# Patient Record
Sex: Female | Born: 1955 | Race: White | Hispanic: No | Marital: Married | Smoking: Never smoker
Health system: Southern US, Community
[De-identification: ages and names within clinical notes are randomized; demographics above are authoritative.]

## PROBLEM LIST (undated history)

## (undated) DIAGNOSIS — K760 Fatty (change of) liver, not elsewhere classified: Secondary | ICD-10-CM

## (undated) DIAGNOSIS — E7800 Pure hypercholesterolemia, unspecified: Secondary | ICD-10-CM

## (undated) DIAGNOSIS — F419 Anxiety disorder, unspecified: Secondary | ICD-10-CM

## (undated) DIAGNOSIS — IMO0002 Reserved for concepts with insufficient information to code with codable children: Secondary | ICD-10-CM

## (undated) DIAGNOSIS — R0602 Shortness of breath: Secondary | ICD-10-CM

## (undated) DIAGNOSIS — M65311 Trigger thumb, right thumb: Secondary | ICD-10-CM

## (undated) DIAGNOSIS — E785 Hyperlipidemia, unspecified: Secondary | ICD-10-CM

## (undated) DIAGNOSIS — F329 Major depressive disorder, single episode, unspecified: Secondary | ICD-10-CM

## (undated) DIAGNOSIS — I10 Essential (primary) hypertension: Secondary | ICD-10-CM

## (undated) DIAGNOSIS — M5020 Other cervical disc displacement, unspecified cervical region: Secondary | ICD-10-CM

## (undated) DIAGNOSIS — F32A Depression, unspecified: Secondary | ICD-10-CM

## (undated) HISTORY — PX: TUBAL LIGATION: SHX77

## (undated) HISTORY — DX: Fatty (change of) liver, not elsewhere classified: K76.0

## (undated) HISTORY — PX: CARPAL TUNNEL RELEASE: SHX101

## (undated) HISTORY — DX: Hyperlipidemia, unspecified: E78.5

## (undated) HISTORY — DX: Other cervical disc displacement, unspecified cervical region: M50.20

## (undated) HISTORY — DX: Shortness of breath: R06.02

---

## 1984-07-05 HISTORY — PX: TUBAL LIGATION: SHX77

## 1998-09-24 ENCOUNTER — Encounter: Payer: Self-pay | Admitting: Obstetrics and Gynecology

## 1998-09-24 ENCOUNTER — Ambulatory Visit (HOSPITAL_COMMUNITY): Admission: RE | Admit: 1998-09-24 | Discharge: 1998-09-24 | Payer: Self-pay | Admitting: Obstetrics and Gynecology

## 1999-09-29 ENCOUNTER — Ambulatory Visit (HOSPITAL_COMMUNITY): Admission: RE | Admit: 1999-09-29 | Discharge: 1999-09-29 | Payer: Self-pay | Admitting: Obstetrics and Gynecology

## 1999-09-29 ENCOUNTER — Encounter: Payer: Self-pay | Admitting: Obstetrics and Gynecology

## 1999-10-23 ENCOUNTER — Other Ambulatory Visit: Admission: RE | Admit: 1999-10-23 | Discharge: 1999-10-23 | Payer: Self-pay | Admitting: Obstetrics and Gynecology

## 2000-09-29 ENCOUNTER — Ambulatory Visit (HOSPITAL_COMMUNITY): Admission: RE | Admit: 2000-09-29 | Discharge: 2000-09-29 | Payer: Self-pay | Admitting: Obstetrics and Gynecology

## 2000-09-29 ENCOUNTER — Encounter: Payer: Self-pay | Admitting: Obstetrics and Gynecology

## 2000-12-13 ENCOUNTER — Other Ambulatory Visit: Admission: RE | Admit: 2000-12-13 | Discharge: 2000-12-13 | Payer: Self-pay | Admitting: Obstetrics and Gynecology

## 2001-11-10 ENCOUNTER — Encounter: Payer: Self-pay | Admitting: Obstetrics and Gynecology

## 2001-11-10 ENCOUNTER — Ambulatory Visit (HOSPITAL_COMMUNITY): Admission: RE | Admit: 2001-11-10 | Discharge: 2001-11-10 | Payer: Self-pay | Admitting: Obstetrics and Gynecology

## 2002-11-14 ENCOUNTER — Ambulatory Visit (HOSPITAL_COMMUNITY): Admission: RE | Admit: 2002-11-14 | Discharge: 2002-11-14 | Payer: Self-pay | Admitting: Obstetrics and Gynecology

## 2002-11-14 ENCOUNTER — Encounter: Payer: Self-pay | Admitting: Obstetrics and Gynecology

## 2003-11-19 ENCOUNTER — Ambulatory Visit (HOSPITAL_COMMUNITY): Admission: RE | Admit: 2003-11-19 | Discharge: 2003-11-19 | Payer: Self-pay | Admitting: Obstetrics and Gynecology

## 2003-12-26 ENCOUNTER — Other Ambulatory Visit: Admission: RE | Admit: 2003-12-26 | Discharge: 2003-12-26 | Payer: Self-pay | Admitting: Obstetrics and Gynecology

## 2004-12-07 ENCOUNTER — Ambulatory Visit (HOSPITAL_COMMUNITY): Admission: RE | Admit: 2004-12-07 | Discharge: 2004-12-07 | Payer: Self-pay | Admitting: Obstetrics and Gynecology

## 2005-01-27 ENCOUNTER — Other Ambulatory Visit: Admission: RE | Admit: 2005-01-27 | Discharge: 2005-01-27 | Payer: Self-pay | Admitting: Obstetrics and Gynecology

## 2005-03-15 ENCOUNTER — Encounter: Admission: RE | Admit: 2005-03-15 | Discharge: 2005-03-15 | Payer: Self-pay | Admitting: Family Medicine

## 2005-12-08 ENCOUNTER — Ambulatory Visit (HOSPITAL_COMMUNITY): Admission: RE | Admit: 2005-12-08 | Discharge: 2005-12-08 | Payer: Self-pay | Admitting: Obstetrics and Gynecology

## 2006-12-13 ENCOUNTER — Ambulatory Visit (HOSPITAL_COMMUNITY): Admission: RE | Admit: 2006-12-13 | Discharge: 2006-12-13 | Payer: Self-pay | Admitting: Obstetrics and Gynecology

## 2007-04-28 ENCOUNTER — Encounter: Admission: RE | Admit: 2007-04-28 | Discharge: 2007-04-28 | Payer: Self-pay | Admitting: Gastroenterology

## 2007-12-14 ENCOUNTER — Ambulatory Visit (HOSPITAL_COMMUNITY): Admission: RE | Admit: 2007-12-14 | Discharge: 2007-12-14 | Payer: Self-pay | Admitting: Obstetrics and Gynecology

## 2008-06-21 ENCOUNTER — Encounter: Admission: RE | Admit: 2008-06-21 | Discharge: 2008-06-21 | Payer: Self-pay | Admitting: Family Medicine

## 2008-12-18 ENCOUNTER — Ambulatory Visit (HOSPITAL_COMMUNITY): Admission: RE | Admit: 2008-12-18 | Discharge: 2008-12-18 | Payer: Self-pay | Admitting: Family Medicine

## 2009-01-24 ENCOUNTER — Ambulatory Visit (HOSPITAL_BASED_OUTPATIENT_CLINIC_OR_DEPARTMENT_OTHER): Admission: RE | Admit: 2009-01-24 | Discharge: 2009-01-24 | Payer: Self-pay | Admitting: Orthopedic Surgery

## 2009-01-24 HISTORY — PX: TRIGGER FINGER RELEASE: SHX641

## 2009-07-03 ENCOUNTER — Ambulatory Visit (HOSPITAL_BASED_OUTPATIENT_CLINIC_OR_DEPARTMENT_OTHER): Admission: RE | Admit: 2009-07-03 | Discharge: 2009-07-03 | Payer: Self-pay | Admitting: Orthopedic Surgery

## 2009-07-03 HISTORY — PX: TRIGGER FINGER RELEASE: SHX641

## 2009-11-23 ENCOUNTER — Ambulatory Visit: Payer: Self-pay | Admitting: Cardiology

## 2009-11-23 ENCOUNTER — Encounter (INDEPENDENT_AMBULATORY_CARE_PROVIDER_SITE_OTHER): Payer: Self-pay | Admitting: Internal Medicine

## 2009-11-24 ENCOUNTER — Ambulatory Visit: Payer: Self-pay | Admitting: Vascular Surgery

## 2009-11-24 ENCOUNTER — Encounter (INDEPENDENT_AMBULATORY_CARE_PROVIDER_SITE_OTHER): Payer: Self-pay | Admitting: Internal Medicine

## 2009-12-24 ENCOUNTER — Ambulatory Visit (HOSPITAL_COMMUNITY): Admission: RE | Admit: 2009-12-24 | Discharge: 2009-12-24 | Payer: Self-pay | Admitting: Obstetrics and Gynecology

## 2010-06-11 ENCOUNTER — Observation Stay (HOSPITAL_COMMUNITY): Admission: EM | Admit: 2010-06-11 | Discharge: 2009-11-24 | Payer: Self-pay | Admitting: Emergency Medicine

## 2010-09-21 LAB — URINE MICROSCOPIC-ADD ON

## 2010-09-21 LAB — COMPREHENSIVE METABOLIC PANEL
ALT: 34 U/L (ref 0–35)
AST: 37 U/L (ref 0–37)
Albumin: 4.3 g/dL (ref 3.5–5.2)
Alkaline Phosphatase: 80 U/L (ref 39–117)
BUN: 25 mg/dL — ABNORMAL HIGH (ref 6–23)
CO2: 20 mEq/L (ref 19–32)
Calcium: 9.8 mg/dL (ref 8.4–10.5)
Chloride: 104 mEq/L (ref 96–112)
Creatinine, Ser: 1.1 mg/dL (ref 0.4–1.2)
GFR calc Af Amer: >60 mL/min (ref >60)
GFR calc non Af Amer: 52 mL/min — ABNORMAL LOW (ref >60)
Glucose, Bld: 173 mg/dL — ABNORMAL HIGH (ref 70–99)
Potassium: 4.1 mEq/L (ref 3.5–5.1)
Sodium: 137 mEq/L (ref 135–145)
Total Bilirubin: 0.7 mg/dL (ref 0.3–1.2)
Total Protein: 7 g/dL (ref 6.0–8.3)

## 2010-09-21 LAB — DIFFERENTIAL
Basophils Absolute: 0.1 10*3/uL (ref 0.0–0.1)
Basophils Relative: 1 % (ref 0–1)
Eosinophils Absolute: 0 10*3/uL (ref 0.0–0.7)
Eosinophils Relative: 0 % (ref 0–5)
Lymphocytes Relative: 19 % (ref 12–46)
Lymphs Abs: 2 10*3/uL (ref 0.7–4.0)
Monocytes Absolute: 0.5 10*3/uL (ref 0.1–1.0)
Monocytes Relative: 5 % (ref 3–12)
Neutro Abs: 8.3 10*3/uL — ABNORMAL HIGH (ref 1.7–7.7)
Neutrophils Relative %: 76 % (ref 43–77)

## 2010-09-21 LAB — BASIC METABOLIC PANEL
BUN: 18 mg/dL (ref 6–23)
CO2: 24 mEq/L (ref 19–32)
Calcium: 8.5 mg/dL (ref 8.4–10.5)
Chloride: 111 mEq/L (ref 96–112)
Creatinine, Ser: 0.98 mg/dL (ref 0.4–1.2)
GFR calc Af Amer: >60 mL/min (ref >60)
GFR calc non Af Amer: 59 mL/min — ABNORMAL LOW (ref >60)
Glucose, Bld: 160 mg/dL — ABNORMAL HIGH (ref 70–99)
Potassium: 4.1 mEq/L (ref 3.5–5.1)
Sodium: 140 mEq/L (ref 135–145)

## 2010-09-21 LAB — TSH: TSH: 1.853 u[IU]/mL (ref 0.350–4.500)

## 2010-09-21 LAB — CBC
HCT: 29.8 % — ABNORMAL LOW (ref 36.0–46.0)
HCT: 34.5 % — ABNORMAL LOW (ref 36.0–46.0)
Hemoglobin: 10.2 g/dL — ABNORMAL LOW (ref 12.0–15.0)
Hemoglobin: 11.8 g/dL — ABNORMAL LOW (ref 12.0–15.0)
MCHC: 34 g/dL (ref 30.0–36.0)
MCHC: 34.2 g/dL (ref 30.0–36.0)
MCV: 92.4 fL (ref 78.0–100.0)
MCV: 92.6 fL (ref 78.0–100.0)
Platelets: 159 10*3/uL (ref 150–400)
Platelets: 245 10*3/uL (ref 150–400)
RBC: 3.22 MIL/uL — ABNORMAL LOW (ref 3.87–5.11)
RBC: 3.74 MIL/uL — ABNORMAL LOW (ref 3.87–5.11)
RDW: 13 % (ref 11.5–15.5)
RDW: 13.2 % (ref 11.5–15.5)
WBC: 11 10*3/uL — ABNORMAL HIGH (ref 4.0–10.5)
WBC: 6.9 10*3/uL (ref 4.0–10.5)

## 2010-09-21 LAB — LIPID PANEL
Cholesterol: 101 mg/dL (ref 0–200)
HDL: 34 mg/dL — ABNORMAL LOW (ref >39)
LDL Cholesterol: 39 mg/dL (ref 0–99)
Total CHOL/HDL Ratio: 3 RATIO
Triglycerides: 138 mg/dL (ref <150)
VLDL: 28 mg/dL (ref 0–40)

## 2010-09-21 LAB — URINALYSIS, ROUTINE W REFLEX MICROSCOPIC
Bilirubin Urine: NEGATIVE
Glucose, UA: NEGATIVE mg/dL
Hgb urine dipstick: NEGATIVE
Ketones, ur: NEGATIVE mg/dL
Nitrite: NEGATIVE
Protein, ur: NEGATIVE mg/dL
Specific Gravity, Urine: 1.029 (ref 1.005–1.030)
Urobilinogen, UA: 0.2 mg/dL (ref 0.0–1.0)
pH: 5.5 (ref 5.0–8.0)

## 2010-09-21 LAB — GLUCOSE, CAPILLARY
Glucose-Capillary: 123 mg/dL — ABNORMAL HIGH (ref 70–99)
Glucose-Capillary: 138 mg/dL — ABNORMAL HIGH (ref 70–99)
Glucose-Capillary: 176 mg/dL — ABNORMAL HIGH (ref 70–99)
Glucose-Capillary: 185 mg/dL — ABNORMAL HIGH (ref 70–99)
Glucose-Capillary: 185 mg/dL — ABNORMAL HIGH (ref 70–99)
Glucose-Capillary: 196 mg/dL — ABNORMAL HIGH (ref 70–99)

## 2010-09-21 LAB — CARDIAC PANEL(CRET KIN+CKTOT+MB+TROPI)
CK, MB: 2 ng/mL (ref 0.3–4.0)
CK, MB: 2.1 ng/mL (ref 0.3–4.0)
Relative Index: 1.4 (ref 0.0–2.5)
Relative Index: 1.4 (ref 0.0–2.5)
Total CK: 144 U/L (ref 7–177)
Total CK: 148 U/L (ref 7–177)
Troponin I: 0.01 ng/mL (ref 0.00–0.06)
Troponin I: <0.01 ng/mL (ref 0.00–0.06)

## 2010-09-21 LAB — CK TOTAL AND CKMB (NOT AT ARMC)
CK, MB: 2.2 ng/mL (ref 0.3–4.0)
Relative Index: 1.6 (ref 0.0–2.5)
Total CK: 140 U/L (ref 7–177)

## 2010-09-21 LAB — POCT CARDIAC MARKERS
CKMB, poc: <1 ng/mL — ABNORMAL LOW (ref 1.0–8.0)
Myoglobin, poc: 128 ng/mL (ref 12–200)
Troponin i, poc: <0.05 ng/mL (ref 0.00–0.09)

## 2010-09-21 LAB — HEMOGLOBIN A1C
Hgb A1c MFr Bld: 7.4 % — ABNORMAL HIGH (ref <5.7)
Mean Plasma Glucose: 166 mg/dL — ABNORMAL HIGH (ref <117)

## 2010-09-21 LAB — TROPONIN I: Troponin I: 0.01 ng/mL (ref 0.00–0.06)

## 2010-10-05 LAB — POCT I-STAT, CHEM 8
BUN: 15 mg/dL (ref 6–23)
Calcium, Ion: 1.22 mmol/L (ref 1.12–1.32)
Chloride: 104 mEq/L (ref 96–112)
Creatinine, Ser: 0.7 mg/dL (ref 0.4–1.2)
Glucose, Bld: 202 mg/dL — ABNORMAL HIGH (ref 70–99)
HCT: 38 % (ref 36.0–46.0)
Hemoglobin: 12.9 g/dL (ref 12.0–15.0)
Potassium: 4.4 mEq/L (ref 3.5–5.1)
Sodium: 136 mEq/L (ref 135–145)
TCO2: 23 mmol/L (ref 0–100)

## 2010-10-05 LAB — GLUCOSE, CAPILLARY: Glucose-Capillary: 155 mg/dL — ABNORMAL HIGH (ref 70–99)

## 2010-10-11 LAB — POCT HEMOGLOBIN-HEMACUE: Hemoglobin: 14.8 g/dL (ref 12.0–15.0)

## 2010-10-11 LAB — BASIC METABOLIC PANEL
BUN: 22 mg/dL (ref 6–23)
CO2: 27 mEq/L (ref 19–32)
Calcium: 9.7 mg/dL (ref 8.4–10.5)
Chloride: 102 mEq/L (ref 96–112)
Creatinine, Ser: 1.01 mg/dL (ref 0.4–1.2)
GFR calc Af Amer: >60 mL/min (ref >60)
GFR calc non Af Amer: 57 mL/min — ABNORMAL LOW (ref >60)
Glucose, Bld: 215 mg/dL — ABNORMAL HIGH (ref 70–99)
Potassium: 4.7 mEq/L (ref 3.5–5.1)
Sodium: 137 mEq/L (ref 135–145)

## 2010-10-11 LAB — GLUCOSE, CAPILLARY: Glucose-Capillary: 113 mg/dL — ABNORMAL HIGH (ref 70–99)

## 2010-11-17 NOTE — Op Note (Signed)
**Note Morgan Guerra** NAMEDEVRA, STARE             ACCOUNT NO.:  [id number]   MEDICAL RECORD NO.:  [id number]          PATIENT TYPE:  AMB   LOCATION:  DSC                          FACILITY:  [facility]   PHYSICIAN:  Cindee Salt, M.D.       DATE OF BIRTH:  October 29, 1955   DATE OF PROCEDURE:  01/24/2009  DATE OF DISCHARGE:                               OPERATIVE REPORT   PREOPERATIVE DIAGNOSIS:  Stenosing tenosynovitis, left ring finger.   POSTOPERATIVE DIAGNOSIS:  Stenosing tenosynovitis, left ring finger.   OPERATION:  Release of A1 pulley, left ring finger.   SURGEON:  Cindee Salt, MD   ASSISTANT:  Carolyne Fiscal, RN   ANESTHESIA:  Forearm based IV regional with local infiltration.   ANESTHESIOLOGIST:  Quita Skye. Krista Blue, MD   HISTORY:  The patient is a 55 year old female with a history of  triggering of her left ring finger.  This has not responded to  conservative treatment.  She has elected to undergo surgical release.  Pre, peri, and postoperative course has been discussed along with risks  and complications.  She is aware that there is no guarantee with the  surgery, possibility of infection, recurrence, injury to arteries,  nerves infection, possibility of complex regional pain, and stiffness.  She has elected to proceed to have this done.  In preoperative area, the  patient is seen, the extremity marked by both the patient and surgeon,  and antibiotic given.   PROCEDURE IN DETAIL:  The patient was brought to the operating room  where a forearm based IV regional anesthetic was carried out without  difficulty.  She was prepped using ChloraPrep, supine position with her  left arm free.  A 3-minute dry time was allowed.  Time-out was taken  confirming the patient and the procedure.  The patient was then draped.  An oblique incision was made over the A1 pulley of the left ring finger  and carried down through subcutaneous tissue.  Bleeders were  electrocauterized.  All neurovascular structures identified  and  protected.  The A1 pulley was identified.  This was found to be  thickened with a moderate tenosynovitis present proximally.  An incision  was then made on the radial aspect releasing the entire A1 pulley.  A  small incision was made centrally in the A2 pulley.  Flexion/extension  revealed no further triggering.  The wound was irrigated and closed with  interrupted 5-0 Vicryl Rapide sutures.  A local infiltration was given  with 0.25% Marcaine without epinephrine.  A  sterile compressive dressing with the fingers free was applied.  On  deflation of the tourniquet, all fingers immediately pinked.  She was  taken to the recovery room for observation in satisfactory condition.  She will be discharged home to return to the [facility]  in 1 week on Vicodin.     ______________________________  Cindee Salt, M.D.    ______________________________  Cindee Salt, M.D.    GK/MEDQ  D:  01/24/2009  T:  01/25/2009  Job:  [id number]   cc:   Donia Guiles, M.D.

## 2010-11-26 ENCOUNTER — Other Ambulatory Visit (HOSPITAL_COMMUNITY): Payer: Self-pay | Admitting: Obstetrics and Gynecology

## 2010-11-26 DIAGNOSIS — Z1231 Encounter for screening mammogram for malignant neoplasm of breast: Secondary | ICD-10-CM

## 2011-01-07 ENCOUNTER — Ambulatory Visit (HOSPITAL_COMMUNITY)
Admission: RE | Admit: 2011-01-07 | Discharge: 2011-01-07 | Disposition: A | Discharge status: Home / Self Care | Payer: BC Managed Care – PPO | Source: Ambulatory Visit | Attending: Obstetrics and Gynecology | Admitting: Obstetrics and Gynecology

## 2011-01-07 DIAGNOSIS — Z1231 Encounter for screening mammogram for malignant neoplasm of breast: Secondary | ICD-10-CM | POA: Insufficient documentation

## 2011-04-20 ENCOUNTER — Ambulatory Visit (HOSPITAL_BASED_OUTPATIENT_CLINIC_OR_DEPARTMENT_OTHER)
Admission: RE | Admit: 2011-04-20 | Payer: BC Managed Care – PPO | Source: Ambulatory Visit | Admitting: Orthopedic Surgery

## 2011-05-12 ENCOUNTER — Other Ambulatory Visit: Payer: Self-pay | Admitting: Orthopedic Surgery

## 2011-05-17 ENCOUNTER — Encounter (HOSPITAL_BASED_OUTPATIENT_CLINIC_OR_DEPARTMENT_OTHER): Payer: Self-pay | Admitting: *Deleted

## 2011-05-18 ENCOUNTER — Encounter (HOSPITAL_BASED_OUTPATIENT_CLINIC_OR_DEPARTMENT_OTHER)
Admission: RE | Disposition: A | Discharge status: Home / Self Care | Payer: Self-pay | Source: Ambulatory Visit | Attending: Orthopedic Surgery

## 2011-05-18 ENCOUNTER — Ambulatory Visit (HOSPITAL_BASED_OUTPATIENT_CLINIC_OR_DEPARTMENT_OTHER): Payer: BC Managed Care – PPO | Admitting: Anesthesiology

## 2011-05-18 ENCOUNTER — Encounter (HOSPITAL_BASED_OUTPATIENT_CLINIC_OR_DEPARTMENT_OTHER): Payer: Self-pay | Admitting: Anesthesiology

## 2011-05-18 ENCOUNTER — Encounter (HOSPITAL_BASED_OUTPATIENT_CLINIC_OR_DEPARTMENT_OTHER): Payer: Self-pay | Admitting: *Deleted

## 2011-05-18 ENCOUNTER — Encounter (HOSPITAL_BASED_OUTPATIENT_CLINIC_OR_DEPARTMENT_OTHER): Payer: Self-pay | Admitting: Orthopedic Surgery

## 2011-05-18 ENCOUNTER — Ambulatory Visit (HOSPITAL_BASED_OUTPATIENT_CLINIC_OR_DEPARTMENT_OTHER)
Admission: RE | Admit: 2011-05-18 | Discharge: 2011-05-18 | Disposition: A | Discharge status: Home / Self Care | Payer: BC Managed Care – PPO | Source: Ambulatory Visit | Attending: Orthopedic Surgery | Admitting: Orthopedic Surgery

## 2011-05-18 DIAGNOSIS — E669 Obesity, unspecified: Secondary | ICD-10-CM | POA: Insufficient documentation

## 2011-05-18 DIAGNOSIS — E119 Type 2 diabetes mellitus without complications: Secondary | ICD-10-CM | POA: Insufficient documentation

## 2011-05-18 DIAGNOSIS — M65839 Other synovitis and tenosynovitis, unspecified forearm: Secondary | ICD-10-CM | POA: Insufficient documentation

## 2011-05-18 DIAGNOSIS — I10 Essential (primary) hypertension: Secondary | ICD-10-CM | POA: Insufficient documentation

## 2011-05-18 DIAGNOSIS — M6530 Trigger finger, unspecified finger: Secondary | ICD-10-CM | POA: Insufficient documentation

## 2011-05-18 DIAGNOSIS — M65849 Other synovitis and tenosynovitis, unspecified hand: Secondary | ICD-10-CM | POA: Insufficient documentation

## 2011-05-18 HISTORY — PX: TRIGGER FINGER RELEASE: SHX641

## 2011-05-18 HISTORY — DX: Essential (primary) hypertension: I10

## 2011-05-18 LAB — POCT I-STAT, CHEM 8
BUN: 24 mg/dL — ABNORMAL HIGH (ref 6–23)
Calcium, Ion: 1.03 mmol/L — ABNORMAL LOW (ref 1.12–1.32)
Chloride: 106 mEq/L (ref 96–112)
Creatinine, Ser: 0.6 mg/dL (ref 0.50–1.10)
Glucose, Bld: 196 mg/dL — ABNORMAL HIGH (ref 70–99)
HCT: 38 % (ref 36.0–46.0)
Hemoglobin: 12.9 g/dL (ref 12.0–15.0)
Potassium: 5.3 mEq/L — ABNORMAL HIGH (ref 3.5–5.1)
Sodium: 135 mEq/L (ref 135–145)
TCO2: 22 mmol/L (ref 0–100)

## 2011-05-18 SURGERY — RELEASE, A1 PULLEY, FOR TRIGGER FINGER
Anesthesia: Regional | Site: Hand | Laterality: Left | Wound class: Clean

## 2011-05-18 MED ORDER — PROPOFOL 10 MG/ML IV EMUL
INTRAVENOUS | Status: DC | PRN
  Administered 2011-05-18: 60 ug/kg/min via INTRAVENOUS

## 2011-05-18 MED ORDER — PROMETHAZINE HCL 25 MG/ML IJ SOLN: 6.2500 mg | INTRAMUSCULAR | Status: DC | PRN

## 2011-05-18 MED ORDER — MIDAZOLAM HCL 5 MG/5ML IJ SOLN
INTRAMUSCULAR | Status: DC | PRN
  Administered 2011-05-18: 0.5 mg via INTRAVENOUS
  Administered 2011-05-18: 2 mg via INTRAVENOUS

## 2011-05-18 MED ORDER — LACTATED RINGERS IV SOLN
INTRAVENOUS | Status: DC
  Administered 2011-05-18: 09:00:00 via INTRAVENOUS

## 2011-05-18 MED ORDER — CEFAZOLIN SODIUM 1-5 GM-% IV SOLN
1.0000 g | INTRAVENOUS | Status: AC
  Administered 2011-05-18: 2 g via INTRAVENOUS

## 2011-05-18 MED ORDER — MEPERIDINE HCL 25 MG/ML IJ SOLN: 6.2500 mg | INTRAMUSCULAR | Status: DC | PRN

## 2011-05-18 MED ORDER — ONDANSETRON HCL 4 MG/2ML IJ SOLN
INTRAMUSCULAR | Status: DC | PRN
  Administered 2011-05-18: 4 mg via INTRAVENOUS

## 2011-05-18 MED ORDER — CHLORHEXIDINE GLUCONATE 4 % EX LIQD: 60.0000 mL | Freq: Once | CUTANEOUS | Status: DC

## 2011-05-18 MED ORDER — HYDROCODONE-ACETAMINOPHEN 5-500 MG PO TABS: 1.0000 | ORAL_TABLET | Freq: Four times a day (QID) | ORAL | Status: AC | PRN

## 2011-05-18 MED ORDER — LIDOCAINE HCL (CARDIAC) 20 MG/ML IV SOLN
INTRAVENOUS | Status: DC | PRN
  Administered 2011-05-18: 25 mg via INTRAVENOUS

## 2011-05-18 MED ORDER — BUPIVACAINE HCL (PF) 0.25 % IJ SOLN
INTRAMUSCULAR | Status: DC | PRN
  Administered 2011-05-18: 5 mL

## 2011-05-18 MED ORDER — FENTANYL CITRATE 0.05 MG/ML IJ SOLN
INTRAMUSCULAR | Status: DC | PRN
  Administered 2011-05-18: 50 ug via INTRAVENOUS
  Administered 2011-05-18: 25 ug via INTRAVENOUS

## 2011-05-18 MED ORDER — HYDROMORPHONE HCL PF 1 MG/ML IJ SOLN: 0.2500 mg | INTRAMUSCULAR | Status: DC | PRN

## 2011-05-18 SURGICAL SUPPLY — 38 items
BANDAGE COBAN STERILE 2 (GAUZE/BANDAGES/DRESSINGS) ×2 IMPLANT
BANDAGE CONFORM 2  STR LF (GAUZE/BANDAGES/DRESSINGS) ×1 IMPLANT
BLADE SURG 15 STRL LF DISP TIS (BLADE) ×1 IMPLANT
BLADE SURG 15 STRL SS (BLADE) ×2
BNDG CMPR 9X4 STRL LF SNTH (GAUZE/BANDAGES/DRESSINGS)
BNDG ESMARK 4X9 LF (GAUZE/BANDAGES/DRESSINGS) IMPLANT
CHLORAPREP W/TINT 26ML (MISCELLANEOUS) ×2 IMPLANT
CLOTH BEACON ORANGE TIMEOUT ST (SAFETY) ×2 IMPLANT
CORDS BIPOLAR (ELECTRODE) IMPLANT
COVER MAYO STAND STRL (DRAPES) ×2 IMPLANT
COVER TABLE BACK 60X90 (DRAPES) ×2 IMPLANT
CUFF TOURNIQUET SINGLE 18IN (TOURNIQUET CUFF) ×1 IMPLANT
DECANTER SPIKE VIAL GLASS SM (MISCELLANEOUS) IMPLANT
DRAPE EXTREMITY T 121X128X90 (DRAPE) ×2 IMPLANT
DRAPE SURG 17X23 STRL (DRAPES) ×2 IMPLANT
GAUZE XEROFORM 1X8 LF (GAUZE/BANDAGES/DRESSINGS) ×2 IMPLANT
GLOVE BIO SURGEON STRL SZ 6.5 (GLOVE) ×2 IMPLANT
GLOVE BIO SURGEON STRL SZ7.5 (GLOVE) ×1 IMPLANT
GLOVE INDICATOR 8.0 STRL GRN (GLOVE) ×1 IMPLANT
GLOVE SKINSENSE NS SZ7.0 (GLOVE) ×1
GLOVE SKINSENSE STRL SZ7.0 (GLOVE) IMPLANT
GLOVE SURG ORTHO 8.0 STRL STRW (GLOVE) ×2 IMPLANT
GOWN BRE IMP PREV XXLGXLNG (GOWN DISPOSABLE) ×3 IMPLANT
GOWN PREVENTION PLUS XLARGE (GOWN DISPOSABLE) ×2 IMPLANT
NEEDLE 27GAX1X1/2 (NEEDLE) ×1 IMPLANT
NS IRRIG 1000ML POUR BTL (IV SOLUTION) ×2 IMPLANT
PACK BASIN DAY SURGERY FS (CUSTOM PROCEDURE TRAY) ×2 IMPLANT
PADDING CAST ABS 4INX4YD NS (CAST SUPPLIES) ×1
PADDING CAST ABS COTTON 4X4 ST (CAST SUPPLIES) ×1 IMPLANT
SPONGE GAUZE 4X4 12PLY (GAUZE/BANDAGES/DRESSINGS) ×2 IMPLANT
SPONGE GAUZE 4X4 FOR O.R. (GAUZE/BANDAGES/DRESSINGS) ×1 IMPLANT
STOCKINETTE 4X48 STRL (DRAPES) ×2 IMPLANT
SUT VICRYL RAPIDE 4/0 PS 2 (SUTURE) ×2 IMPLANT
SYR BULB 3OZ (MISCELLANEOUS) ×2 IMPLANT
SYR CONTROL 10ML LL (SYRINGE) ×1 IMPLANT
TOWEL OR 17X24 6PK STRL BLUE (TOWEL DISPOSABLE) ×4 IMPLANT
UNDERPAD 30X30 INCONTINENT (UNDERPADS AND DIAPERS) ×2 IMPLANT
WATER STERILE IRR 1000ML POUR (IV SOLUTION) ×1 IMPLANT

## 2011-05-18 NOTE — Transfer of Care (Signed)
Immediate Anesthesia Transfer of Care Note  Patient: Morgan Guerra  Procedure(s) Performed:  RELEASE TRIGGER FINGER/A-1 PULLEY - release a-1 pulley left middle and little fingers  Patient Location: PACU  Anesthesia Type: MAC and Regional  Level of Consciousness: awake, alert  and patient cooperative  Airway & Oxygen Therapy: Patient Spontanous Breathing and Patient connected to face mask oxygen  Post-op Assessment: Report given to PACU RN and Post -op Vital signs reviewed and stable  Post vital signs: Reviewed and stable  Complications: No apparent anesthesia complications

## 2011-05-18 NOTE — Progress Notes (Signed)
05/18/2011 1230 notified Dr. Jacklynn Bue of Pt's BP's in Pacu 122/71, 99/59, 103/56/96/64/ 104/53 and 97/46-- Dr.Massagee OK for pt to go home.

## 2011-05-18 NOTE — Anesthesia Postprocedure Evaluation (Signed)
  Anesthesia Post-op Note  Patient: Morgan Guerra  Procedure(s) Performed:  RELEASE TRIGGER FINGER/A-1 PULLEY - release a-1 pulley left middle and little fingers  Patient Location: PACU  Anesthesia Type: Regional  Level of Consciousness: awake  Airway and Oxygen Therapy: Patient Spontanous Breathing  Post-op Pain: none  Post-op Assessment: Post-op Vital signs reviewed  Post-op Vital Signs: stable  Complications: No apparent anesthesia complications

## 2011-05-18 NOTE — H&P (Signed)
Morgan Guerra returns today. She has not been seen in over a year. She is complaining of left middle finger catching. She does not know exactly how long this has been going on but it has been a considerable period of time. She recalls no history of injury. She complains of a severe catching. This has not responded to nonsteroidal anti-inflammatories.  OFFICE VISIT:      11.1.12                   DOB:   [birth year]  Morgan Guerra returns to the office today for examination of her stenosing tenosynovitis left middle, left little finger. She developed a flexion contracture of the PIP joint to the middle finger approximately 15 to 20 degrees. She is able to make composite fist, has triggering. She has developed triggering of her right thumb.  She is wearing a splint on that side. She has elected to proceed to have the left side A-1 pulley released on the left middle and left little. This will be done at [facility] as an outpatient.  Morgan Guerra is an 55 y.o. female.   Chief Complaint :trigger fingers HPI: see above  Past Medical History  Diagnosis Date  . Hypertension   . Diabetes mellitus     Past Surgical History  Procedure Date  . Colonoscopy   . Trigger finger release     multiple  . Tubal ligation   . Carpal tunnel release     rt    History reviewed. No pertinent family history. Social History:  reports that she has never smoked. She does not have any smokeless tobacco history on file. She reports that she does not drink alcohol or use illicit drugs.  Allergies: No Known Allergies  Medications Prior to Admission  Medication Dose Route Frequency Provider Last Rate Last Dose  . lactated ringers infusion   Intravenous Continuous Zenon Mayo, MD 20 mL/hr at 05/18/11 [phone]     Medications Prior to Admission  Medication Sig Dispense Refill  . cholecalciferol (VITAMIN D) 1000 UNITS tablet Take 1,000 Units by mouth daily.        . colesevelam (WELCHOL) 625 MG tablet Take 1,875  mg by mouth 2 (two) times daily with a meal.        . glipiZIDE (GLUCOTROL) 10 MG tablet Take 10 mg by mouth 1 day or 1 dose.        Marland Kitchen lisinopril-hydrochlorothiazide (PRINZIDE,ZESTORETIC) 20-12.5 MG per tablet Take by mouth daily.        . simvastatin (ZOCOR) 40 MG tablet Take 20 mg by mouth at bedtime.        . sitaGLIPtan-metformin (JANUMET) 50-1000 MG per tablet Take 1 tablet by mouth 2 (two) times daily with a meal.        . venlafaxine (EFFEXOR-XR) 150 MG 24 hr capsule Take 150 mg by mouth daily.          Results for orders placed during the hospital encounter of 05/18/11 (from the past 48 hour(s))  POCT I-STAT, CHEM 8     Status: Abnormal   Collection Time   05/18/11  8:27 AM      Component Value Range Comment   Sodium 135  135 - 145 (mEq/L)    Potassium 5.3 (*) 3.5 - 5.1 (mEq/L)    Chloride 106  96 - 112 (mEq/L)    BUN 24 (*) 6 - 23 (mg/dL)    Creatinine, Ser 4.09  0.50 - 1.10 (mg/dL)  Glucose, Bld 196 (*) 70 - 99 (mg/dL)    Calcium, Ion 4.09 (*) 1.12 - 1.32 (mmol/L)    TCO2 22  0 - 100 (mmol/L)    Hemoglobin 12.9  12.0 - 15.0 (g/dL)    HCT 81.1  91.4 - 78.2 (%)     No results found.   A comprehensive review of systems was negative.  Blood pressure 122/77, pulse 91, temperature 98.4 F (36.9 C), temperature source Oral, resp. rate 16, height 5' 6.5" (1.689 m), weight 103.42 kg (228 lb), SpO2 97.00%.  General appearance: alert and cooperative Head: Normocephalic, without obvious abnormality Neck: no adenopathy Resp: clear to auscultation bilaterally Cardio: regular rate and rhythm, S1, S2 normal, no murmur, click, rub or gallop GI: soft, non-tender; bowel sounds normal; no masses,  no organomegaly Extremities: extremities normal, atraumatic, no cyanosis or edema Pulses: 2+ and symmetric Skin: Skin color, texture, turgor normal. No rashes or lesions Neurologic: Grossly normal Incision/Wound: na  Assessment/Plan Release stslmf and llf  Muneer Leider R 05/18/2011,  10:09 AM

## 2011-05-18 NOTE — Anesthesia Procedure Notes (Signed)
Procedure Name: MAC Date/Time: 05/18/2011 10:32 AM Performed by: Radford Pax Pre-anesthesia Checklist: Patient identified, Timeout performed, Emergency Drugs available, Suction available and Patient being monitored Patient Re-evaluated:Patient Re-evaluated prior to inductionOxygen Delivery Method: Simple face mask Dental Injury: Teeth and Oropharynx as per pre-operative assessment

## 2011-05-18 NOTE — Anesthesia Preprocedure Evaluation (Signed)
Anesthesia Evaluation  Patient identified by MRN, date of birth, ID band Patient awake    Reviewed: Allergy & Precautions, H&P , NPO status , Patient's Chart, lab work & pertinent test results  History of Anesthesia Complications Negative for: history of anesthetic complications  Airway Mallampati: I  Neck ROM: Full    Dental  (+) Teeth Intact   Pulmonary neg pulmonary ROS,  clear to auscultation        Cardiovascular hypertension, Regular Normal    Neuro/Psych    GI/Hepatic negative GI ROS, Neg liver ROS,   Endo/Other  Diabetes mellitus-  Renal/GU      Musculoskeletal   Abdominal (+) obese,   Peds  Hematology   Anesthesia Other Findings   Reproductive/Obstetrics                           Anesthesia Physical Anesthesia Plan  ASA: II  Anesthesia Plan: Bier Block   Post-op Pain Management:    Induction: Intravenous  Airway Management Planned: Simple Face Mask and Natural Airway  Additional Equipment:   Intra-op Plan:   Post-operative Plan:   Informed Consent: I have reviewed the patients History and Physical, chart, labs and discussed the procedure including the risks, benefits and alternatives for the proposed anesthesia with the patient or authorized representative who has indicated his/her understanding and acceptance.   Dental advisory given  Plan Discussed with: CRNA and Surgeon  Anesthesia Plan Comments:         Anesthesia Quick Evaluation

## 2011-05-18 NOTE — Op Note (Signed)
DESCRIPTION OF PROCEDURE:  The patient was brought to the operating room where a forearm based IV regional anesthetic was carried out without difficulty. He was prepped using ChloraPrep, supine position with the left arm free.  A 3-minute dry time was allowed.  Time-out taken confirming patient procedure.  A transverse incision was made over the A1 pulley of the middle fingerand carried down through subcutaneous tissue. Bleeders were electrocauterized.  The A1 pulley was identified. Retractors placed protecting neurovascular bundles.  Significant thickening of the A1 pulley was noted.  A proximal tenosynovitis was also present.  The A1 pulley was released on its radial aspect.  No further triggering was noted with full flexion extension.  A separate incision was then made obliquely on The little finger, metacarpophalangeal joint carried down through subcutaneous tissue.  Bleeders again electrocauterized.  The dissection carried down to the A1 pulley.  A very significant thickening of the tenosynovial tissue was present proximally with thickening of the A1 pulley.  The A1 pulley was released.  A small incision made centrally in A2.  A tenosynovectomy was then performed at the tenosynovium of the superficialis profundus, allowing full mobility without any further triggering.   A local injection of marcaine  was given to each incision a otal of 5 mL was used. A sterile compressive dressing was applied.  On deflation of the tourniquet, all fingers immediately pinked.  She was taken to the recovery room for observation in satisfactory condition.

## 2011-05-18 NOTE — Brief Op Note (Signed)
05/18/2011  11:00 AM  PATIENT:  Morgan Guerra  55 y.o. female  PRE-OPERATIVE DIAGNOSIS:  lt middle and little fingers  POST-OPERATIVE DIAGNOSIS:  * same *  PROCEDURE:  Procedure(s): RELEASE TRIGGER FINGER/A-1 PULLEY  SURGEON:  Surgeon(s): Nicki Reaper, MD Tami Ribas  PHYSICIAN ASSISTANT:   ASSISTANTS: none   ANESTHESIA:   local and regional  EBL:  Total I/O In: 1000 [I.V.:1000] Out: -   BLOOD ADMINISTERED:none  DRAINS: none   LOCAL MEDICATIONS USED:  MARCAINE 5CC  SPECIMEN:  No Specimen  DISPOSITION OF SPECIMEN:  N/A  COUNTS:  YES  TOURNIQUET:   Total Tourniquet Time Documented: area (laterality) - 22 minutes  DICTATION: .Note written in EPIC  PLAN OF CARE: Discharge to home after PACU  PATIENT DISPOSITION:  PACU - hemodynamically stable.

## 2011-05-19 LAB — GLUCOSE, CAPILLARY: Glucose-Capillary: 154 mg/dL — ABNORMAL HIGH (ref 70–99)

## 2011-05-22 ENCOUNTER — Encounter (HOSPITAL_BASED_OUTPATIENT_CLINIC_OR_DEPARTMENT_OTHER): Payer: Self-pay | Admitting: Orthopedic Surgery

## 2011-06-02 ENCOUNTER — Other Ambulatory Visit: Payer: Self-pay | Admitting: Orthopedic Surgery

## 2011-06-05 DIAGNOSIS — M65311 Trigger thumb, right thumb: Secondary | ICD-10-CM

## 2011-06-05 HISTORY — DX: Trigger thumb, right thumb: M65.311

## 2011-06-24 ENCOUNTER — Encounter (HOSPITAL_BASED_OUTPATIENT_CLINIC_OR_DEPARTMENT_OTHER): Payer: Self-pay | Admitting: *Deleted

## 2011-06-24 NOTE — Pre-Procedure Instructions (Signed)
To come for BMET and EKG 

## 2011-07-01 ENCOUNTER — Other Ambulatory Visit: Payer: Self-pay

## 2011-07-01 ENCOUNTER — Encounter (HOSPITAL_BASED_OUTPATIENT_CLINIC_OR_DEPARTMENT_OTHER)
Admission: RE | Admit: 2011-07-01 | Discharge: 2011-07-01 | Disposition: A | Discharge status: Home / Self Care | Payer: BC Managed Care – PPO | Source: Ambulatory Visit | Attending: Orthopedic Surgery | Admitting: Orthopedic Surgery

## 2011-07-01 LAB — BASIC METABOLIC PANEL
BUN: 13 mg/dL (ref 6–23)
CO2: 24 mEq/L (ref 19–32)
Calcium: 9.8 mg/dL (ref 8.4–10.5)
Chloride: 99 mEq/L (ref 96–112)
Creatinine, Ser: 0.56 mg/dL (ref 0.50–1.10)
GFR calc Af Amer: >90 mL/min (ref >90)
GFR calc non Af Amer: >90 mL/min (ref >90)
Glucose, Bld: 217 mg/dL — ABNORMAL HIGH (ref 70–99)
Potassium: 3.9 mEq/L (ref 3.5–5.1)
Sodium: 135 mEq/L (ref 135–145)

## 2011-07-02 ENCOUNTER — Encounter (HOSPITAL_BASED_OUTPATIENT_CLINIC_OR_DEPARTMENT_OTHER)
Admission: RE | Disposition: A | Discharge status: Home / Self Care | Payer: Self-pay | Source: Ambulatory Visit | Attending: Orthopedic Surgery

## 2011-07-02 ENCOUNTER — Ambulatory Visit (HOSPITAL_BASED_OUTPATIENT_CLINIC_OR_DEPARTMENT_OTHER): Payer: BC Managed Care – PPO | Admitting: Certified Registered Nurse Anesthetist

## 2011-07-02 ENCOUNTER — Encounter (HOSPITAL_BASED_OUTPATIENT_CLINIC_OR_DEPARTMENT_OTHER): Payer: Self-pay | Admitting: *Deleted

## 2011-07-02 ENCOUNTER — Encounter (HOSPITAL_BASED_OUTPATIENT_CLINIC_OR_DEPARTMENT_OTHER): Payer: Self-pay | Admitting: Certified Registered Nurse Anesthetist

## 2011-07-02 ENCOUNTER — Ambulatory Visit (HOSPITAL_BASED_OUTPATIENT_CLINIC_OR_DEPARTMENT_OTHER)
Admission: RE | Admit: 2011-07-02 | Discharge: 2011-07-02 | Disposition: A | Discharge status: Home / Self Care | Payer: BC Managed Care – PPO | Source: Ambulatory Visit | Attending: Orthopedic Surgery | Admitting: Orthopedic Surgery

## 2011-07-02 ENCOUNTER — Encounter (HOSPITAL_BASED_OUTPATIENT_CLINIC_OR_DEPARTMENT_OTHER): Payer: Self-pay | Admitting: Anesthesiology

## 2011-07-02 DIAGNOSIS — M65849 Other synovitis and tenosynovitis, unspecified hand: Secondary | ICD-10-CM | POA: Insufficient documentation

## 2011-07-02 DIAGNOSIS — Z01812 Encounter for preprocedural laboratory examination: Secondary | ICD-10-CM | POA: Insufficient documentation

## 2011-07-02 DIAGNOSIS — M65839 Other synovitis and tenosynovitis, unspecified forearm: Secondary | ICD-10-CM | POA: Insufficient documentation

## 2011-07-02 DIAGNOSIS — I10 Essential (primary) hypertension: Secondary | ICD-10-CM | POA: Insufficient documentation

## 2011-07-02 DIAGNOSIS — E119 Type 2 diabetes mellitus without complications: Secondary | ICD-10-CM | POA: Insufficient documentation

## 2011-07-02 HISTORY — PX: TRIGGER FINGER RELEASE: SHX641

## 2011-07-02 HISTORY — DX: Pure hypercholesterolemia, unspecified: E78.00

## 2011-07-02 HISTORY — DX: Trigger thumb, right thumb: M65.311

## 2011-07-02 HISTORY — DX: Anxiety disorder, unspecified: F41.9

## 2011-07-02 LAB — GLUCOSE, CAPILLARY
Glucose-Capillary: 261 mg/dL — ABNORMAL HIGH (ref 70–99)
Glucose-Capillary: 258 mg/dL — ABNORMAL HIGH (ref 70–99)

## 2011-07-02 LAB — POCT HEMOGLOBIN-HEMACUE: Hemoglobin: 14.2 g/dL (ref 12.0–15.0)

## 2011-07-02 SURGERY — RELEASE, A1 PULLEY, FOR TRIGGER FINGER
Anesthesia: Monitor Anesthesia Care | Laterality: Right

## 2011-07-02 MED ORDER — ONDANSETRON HCL 4 MG/2ML IJ SOLN
INTRAMUSCULAR | Status: DC | PRN
  Administered 2011-07-02: 4 mg via INTRAVENOUS

## 2011-07-02 MED ORDER — MIDAZOLAM HCL 2 MG/2ML IJ SOLN: 0.5000 mg | INTRAMUSCULAR | Status: DC | PRN

## 2011-07-02 MED ORDER — BUPIVACAINE HCL (PF) 0.25 % IJ SOLN
INTRAMUSCULAR | Status: DC | PRN
  Administered 2011-07-02: 2 mL

## 2011-07-02 MED ORDER — LIDOCAINE HCL (CARDIAC) 20 MG/ML IV SOLN
INTRAVENOUS | Status: DC | PRN
  Administered 2011-07-02: 60 mg via INTRAVENOUS

## 2011-07-02 MED ORDER — PROPOFOL 10 MG/ML IV EMUL
INTRAVENOUS | Status: DC | PRN
  Administered 2011-07-02: 50 ug/kg/min via INTRAVENOUS

## 2011-07-02 MED ORDER — LACTATED RINGERS IV SOLN
INTRAVENOUS | Status: DC
  Administered 2011-07-02: 10:00:00 via INTRAVENOUS

## 2011-07-02 MED ORDER — MORPHINE SULFATE 2 MG/ML IJ SOLN: 0.0500 mg/kg | INTRAMUSCULAR | Status: DC | PRN

## 2011-07-02 MED ORDER — CHLORHEXIDINE GLUCONATE 4 % EX LIQD: 60.0000 mL | Freq: Once | CUTANEOUS | Status: DC

## 2011-07-02 MED ORDER — FENTANYL CITRATE 0.05 MG/ML IJ SOLN: 25.0000 ug | INTRAMUSCULAR | Status: DC | PRN

## 2011-07-02 MED ORDER — METOCLOPRAMIDE HCL 5 MG/ML IJ SOLN: 10.0000 mg | Freq: Once | INTRAMUSCULAR | Status: DC | PRN

## 2011-07-02 MED ORDER — HYDROCODONE-ACETAMINOPHEN 5-500 MG PO TABS: 1.0000 | ORAL_TABLET | ORAL | Status: AC | PRN

## 2011-07-02 MED ORDER — FENTANYL CITRATE 0.05 MG/ML IJ SOLN
INTRAMUSCULAR | Status: DC | PRN
  Administered 2011-07-02 (×2): 50 ug via INTRAVENOUS

## 2011-07-02 MED ORDER — CEFAZOLIN SODIUM 1-5 GM-% IV SOLN
1.0000 g | INTRAVENOUS | Status: AC
  Administered 2011-07-02: 2 g via INTRAVENOUS

## 2011-07-02 MED ORDER — LIDOCAINE HCL (PF) 0.5 % IJ SOLN
INTRAMUSCULAR | Status: DC | PRN
  Administered 2011-07-02: 35 mL

## 2011-07-02 MED ORDER — FENTANYL CITRATE 0.05 MG/ML IJ SOLN: 50.0000 ug | INTRAMUSCULAR | Status: DC | PRN

## 2011-07-02 SURGICAL SUPPLY — 32 items
BANDAGE COBAN STERILE 2 (GAUZE/BANDAGES/DRESSINGS) ×2 IMPLANT
BLADE SURG 15 STRL LF DISP TIS (BLADE) ×1 IMPLANT
BLADE SURG 15 STRL SS (BLADE) ×2
BNDG CMPR 9X4 STRL LF SNTH (GAUZE/BANDAGES/DRESSINGS) ×1
BNDG ESMARK 4X9 LF (GAUZE/BANDAGES/DRESSINGS) ×1 IMPLANT
CHLORAPREP W/TINT 26ML (MISCELLANEOUS) ×2 IMPLANT
CLOTH BEACON ORANGE TIMEOUT ST (SAFETY) ×2 IMPLANT
CORDS BIPOLAR (ELECTRODE) IMPLANT
COVER MAYO STAND STRL (DRAPES) ×2 IMPLANT
COVER TABLE BACK 60X90 (DRAPES) ×2 IMPLANT
CUFF TOURNIQUET SINGLE 18IN (TOURNIQUET CUFF) ×1 IMPLANT
DECANTER SPIKE VIAL GLASS SM (MISCELLANEOUS) IMPLANT
DRAPE EXTREMITY T 121X128X90 (DRAPE) ×2 IMPLANT
DRAPE SURG 17X23 STRL (DRAPES) ×2 IMPLANT
GAUZE XEROFORM 1X8 LF (GAUZE/BANDAGES/DRESSINGS) ×2 IMPLANT
GLOVE BIO SURGEON STRL SZ 6.5 (GLOVE) ×2 IMPLANT
GLOVE SURG ORTHO 8.0 STRL STRW (GLOVE) ×2 IMPLANT
GOWN BRE IMP PREV XXLGXLNG (GOWN DISPOSABLE) ×2 IMPLANT
GOWN PREVENTION PLUS XLARGE (GOWN DISPOSABLE) ×2 IMPLANT
NEEDLE 27GAX1X1/2 (NEEDLE) ×1 IMPLANT
NS IRRIG 1000ML POUR BTL (IV SOLUTION) ×2 IMPLANT
PACK BASIN DAY SURGERY FS (CUSTOM PROCEDURE TRAY) ×2 IMPLANT
PADDING CAST ABS 4INX4YD NS (CAST SUPPLIES) ×1
PADDING CAST ABS COTTON 4X4 ST (CAST SUPPLIES) ×1 IMPLANT
SPONGE GAUZE 4X4 12PLY (GAUZE/BANDAGES/DRESSINGS) ×2 IMPLANT
STOCKINETTE 4X48 STRL (DRAPES) ×2 IMPLANT
SUT VICRYL RAPIDE 4/0 PS 2 (SUTURE) ×2 IMPLANT
SYR BULB 3OZ (MISCELLANEOUS) ×2 IMPLANT
SYR CONTROL 10ML LL (SYRINGE) ×1 IMPLANT
TOWEL OR 17X24 6PK STRL BLUE (TOWEL DISPOSABLE) ×4 IMPLANT
UNDERPAD 30X30 INCONTINENT (UNDERPADS AND DIAPERS) ×2 IMPLANT
WATER STERILE IRR 1000ML POUR (IV SOLUTION) ×1 IMPLANT

## 2011-07-02 NOTE — Transfer of Care (Signed)
Immediate Anesthesia Transfer of Care Note  Patient: Morgan Guerra  Procedure(s) Performed:  RELEASE TRIGGER FINGER/A-1 PULLEY - right thumb  Patient Location: PACU  Anesthesia Type: Bier block  Level of Consciousness: awake and alert   Airway & Oxygen Therapy: Patient Spontanous Breathing and Patient connected to face mask oxygen  Post-op Assessment: Report given to PACU RN, Post -op Vital signs reviewed and stable and Patient moving all extremities X 4  Post vital signs: Reviewed and stable  Complications: No apparent anesthesia complications

## 2011-07-02 NOTE — H&P (Signed)
Morgan Guerra returns today.. She is complaining of right thumb catching. She does not know exactly how long this has been going on but it has been a considerable period of time. She recalls no history of injury. She complains of a severe catching. This has not responded to nonsteroidal anti-inflammatories.   Past Medical History: She is on Lisinopril, Effexor, Simvastatin, and Janumet. Her past surgery is unchanged. She has had trigger finger releases on her opposite side.   Social History: She does not smoke or drink. She is married.  Review of Systems: Positive for glasses, high BP, otherwise negative. Morgan Guerra is an 55 y.o. female.   Chief Complaint: rt sts thumb HPI: see above  Past Medical History  Diagnosis Date  . Anxiety   . Trigger thumb of right hand 06/2011  . Hypertension     under control; has been on med. x "years"  . High cholesterol   . Diabetes mellitus     NIDDM    Past Surgical History  Procedure Date  . Carpal tunnel release     right  . Tubal ligation 1980s  . Trigger finger release 05/18/2011    left middle and small fingers  . Trigger finger release 01/24/2009    left ring  . Trigger finger release 07/03/2009    right ring and little fingers    Family History  Problem Relation Age of Onset  . Anesthesia problems Sister     hard to wake up post-op; has polio   Social History:  reports that she has never smoked. She has never used smokeless tobacco. She reports that she does not drink alcohol or use illicit drugs.  Allergies: No Known Allergies  Medications Prior to Admission  Medication Dose Route Frequency Provider Last Rate Last Dose  . ceFAZolin (ANCEF) IVPB 1 g/50 mL premix  1 g Intravenous 60 min Pre-Op       . chlorhexidine (HIBICLENS) 4 % liquid 4 application  60 mL Topical Once       . lactated ringers infusion   Intravenous Continuous Raiford Simmonds, MD       Medications Prior to Admission  Medication Sig Dispense Refill  .  cholecalciferol (VITAMIN D) 1000 UNITS tablet Take 1,000 Units by mouth daily.       . colesevelam (WELCHOL) 625 MG tablet Take 1,875 mg by mouth 2 (two) times daily with a meal.       . glipiZIDE (GLUCOTROL) 10 MG tablet Take 5 mg by mouth 2 (two) times daily before a meal.       . lisinopril-hydrochlorothiazide (PRINZIDE,ZESTORETIC) 20-12.5 MG per tablet Take 0.5 tablets by mouth daily. AM      . simvastatin (ZOCOR) 40 MG tablet Take 20 mg by mouth daily. AM      . sitaGLIPtan-metformin (JANUMET) 50-1000 MG per tablet Take 1 tablet by mouth 2 (two) times daily with a meal.       . venlafaxine (EFFEXOR-XR) 150 MG 24 hr capsule Take 150 mg by mouth daily. AM      . HYDROcodone-acetaminophen (VICODIN) 5-500 MG per tablet Take 1 tablet by mouth every 6 (six) hours as needed for pain.  30 tablet  0    Results for orders placed during the hospital encounter of 07/02/11 (from the past 48 hour(s))  BASIC METABOLIC PANEL     Status: Abnormal   Collection Time   07/01/11 12:30 PM      Component Value Range Comment   Sodium  135  135 - 145 (mEq/L)    Potassium 3.9  3.5 - 5.1 (mEq/L)    Chloride 99  96 - 112 (mEq/L)    CO2 24  19 - 32 (mEq/L)    Glucose, Bld 217 (*) 70 - 99 (mg/dL)    BUN 13  6 - 23 (mg/dL)    Creatinine, Ser 1.61  0.50 - 1.10 (mg/dL)    Calcium 9.8  8.4 - 10.5 (mg/dL)    GFR calc non Af Amer >90  >90 (mL/min)    GFR calc Af Amer >90  >90 (mL/min)     No results found.   Pertinent items are noted in HPI.  Blood pressure 124/78, pulse 84, temperature 98.3 F (36.8 C), temperature source Oral, resp. rate 20, height 5' 6.5" (1.689 m), weight 102.513 kg (226 lb), SpO2 96.00%.  General appearance: alert, cooperative and appears stated age Head: Normocephalic, without obvious abnormality Neck: no adenopathy Resp: clear to auscultation bilaterally Cardio: regular rate and rhythm, S1, S2 normal, no murmur, click, rub or gallop GI: soft, non-tender; bowel sounds normal; no  masses,  no organomegaly Extremities: extremities normal, atraumatic, no cyanosis or edema Pulses: 2+ and symmetric Skin: Skin color, texture, turgor normal. No rashes or lesions Neurologic: Grossly normal Incision/Wound: na  Assessment/Plan Release sts rt thumb  Lukah Goswami R 07/02/2011, 9:25 AM

## 2011-07-02 NOTE — Anesthesia Procedure Notes (Signed)
Procedure Name: MAC Date/Time: 07/02/2011 10:06 AM Performed by: Gladys Damme Pre-anesthesia Checklist: Patient identified, Timeout performed, Emergency Drugs available, Suction available and Patient being monitored Patient Re-evaluated:Patient Re-evaluated prior to inductionOxygen Delivery Method: Simple face mask Placement Confirmation: positive ETCO2

## 2011-07-02 NOTE — Brief Op Note (Signed)
07/02/2011  10:23 AM  PATIENT:  Morgan Guerra  55 y.o. female  PRE-OPERATIVE DIAGNOSIS:  trigger thumb right  POST-OPERATIVE DIAGNOSIS:  trigger thumb right  PROCEDURE:  Procedure(s): RELEASE TRIGGER FINGER/A-1 PULLEY  SURGEON:  Surgeon(s): Nicki Reaper, MD  PHYSICIAN ASSISTANT:   ASSISTANTS: none   ANESTHESIA:   local and regional  EBL:     BLOOD ADMINISTERED:none  DRAINS: none   LOCAL MEDICATIONS USED:  MARCAINE 2CC  SPECIMEN:  No Specimen  DISPOSITION OF SPECIMEN:  N/A  COUNTS:  YES  TOURNIQUET:  * Missing tourniquet times found for documented tourniquets in log:  [id number] *  DICTATION: .Other Dictation: Dictation Number [id number]  PLAN OF CARE: Discharge to home after PACU  PATIENT DISPOSITION:  PACU - hemodynamically stable.

## 2011-07-02 NOTE — Op Note (Signed)
Dictated number: [id number]

## 2011-07-02 NOTE — Anesthesia Preprocedure Evaluation (Signed)
Anesthesia Evaluation  Patient identified by MRN, date of birth, ID band Patient awake    Reviewed: Allergy & Precautions, H&P , NPO status , Patient's Chart, lab work & pertinent test results, reviewed documented beta blocker date and time   Airway Mallampati: II TM Distance: >3 FB Neck ROM: full    Dental   Pulmonary neg pulmonary ROS,          Cardiovascular hypertension, On Medications     Neuro/Psych Negative Neurological ROS  Negative Psych ROS   GI/Hepatic negative GI ROS, Neg liver ROS,   Endo/Other  Diabetes mellitus-Morbid obesity  Renal/GU negative Renal ROS  Genitourinary negative   Musculoskeletal   Abdominal   Peds  Hematology negative hematology ROS (+)   Anesthesia Other Findings See surgeon's H&P   Reproductive/Obstetrics negative OB ROS                           Anesthesia Physical Anesthesia Plan  ASA: III  Anesthesia Plan: MAC and Bier Block   Post-op Pain Management:    Induction:   Airway Management Planned: Simple Face Mask  Additional Equipment:   Intra-op Plan:   Post-operative Plan:   Informed Consent: I have reviewed the patients History and Physical, chart, labs and discussed the procedure including the risks, benefits and alternatives for the proposed anesthesia with the patient or authorized representative who has indicated his/her understanding and acceptance.     Plan Discussed with: CRNA and Surgeon  Anesthesia Plan Comments:         Anesthesia Quick Evaluation

## 2011-07-02 NOTE — Anesthesia Postprocedure Evaluation (Signed)
Anesthesia Post Note  Patient: Morgan Guerra  Procedure(s) Performed:  RELEASE TRIGGER FINGER/A-1 PULLEY - right thumb  Anesthesia type: General  Patient location: PACU  Post pain: Pain level controlled  Post assessment: Patient's Cardiovascular Status Stable  Last Vitals:  Filed Vitals:   07/02/11 1130  BP: 110/77  Pulse: 72  Temp:   Resp: 14    Post vital signs: Reviewed and stable  Level of consciousness: alert  Complications: No apparent anesthesia complications

## 2011-07-03 NOTE — Op Note (Signed)
NAMESAFFRON, BUSEY NO.:  [id number]  MEDICAL RECORD NO.:  [id number]  LOCATION:                                 FACILITY:  PHYSICIAN:  Cindee Salt, M.D.       DATE OF BIRTH:  1956/01/24  DATE OF PROCEDURE:  07/02/2011 DATE OF DISCHARGE:                              OPERATIVE REPORT   PREOPERATIVE DIAGNOSIS:  Stenosing tenosynovitis, right thumb.  POSTOPERATIVE DIAGNOSIS:  Stenosing tenosynovitis, right thumb.  OPERATION:  Release A1 pulley, right thumb.  SURGEON:  Cindee Salt, MD  ANESTHESIA:  Forearm based IV regional with local infiltration.  HISTORY:  The patient is a 55 year old female with a history of triggering of her right thumb, this has not responded to conservative treatment, she has elected to undergo surgical release.  Pre, peri, and postoperative course have been discussed along with risks and complications.  She is aware there is no guarantee with surgery, possibility of infection, recurrence injury to arteries, nerves, tendons, incomplete relief of symptoms and dystrophy.  In the preoperative area, the patient is seen, the extremity marked by both the patient and surgeon, antibiotic given.  PROCEDURE:  The patient was brought to the operating room where forearm based IV regional anesthetic was carried out without difficulty.  She was prepped using ChloraPrep, supine position, right arm free.  A 3- minute dry time was allowed.  Time-out taken confirming the patient and procedure.  A transverse incision was made over the A1 pulley of the right thumb, carried down through subcutaneous tissue.  Bleeders were electrocauterized with bipolar.  The A1 pulley was identified, this was released on its radial aspect, this was found to be markedly thickened and tight.  The area of compression to the tendon was immediately apparent.  The oblique pulley was left intact.  The wound was irrigated. Skin closed with interrupted 5-0 Vicryl Rapide suture.  A  local infiltration with 0.25% Marcaine without epinephrine was given, approximately 2 mL was used.  A local compressive dressing was applied. On deflation of the tourniquet, all fingers immediately pinked.  She was taken to the recovery room for observation in satisfactory condition. She will be discharged home to [facility] in 1 week on Vicodin.          ______________________________ Cindee Salt, M.D.     GK/MEDQ  D:  07/02/2011  T:  07/03/2011  Job:  [id number]

## 2011-07-05 ENCOUNTER — Encounter (HOSPITAL_BASED_OUTPATIENT_CLINIC_OR_DEPARTMENT_OTHER): Payer: Self-pay | Admitting: Orthopedic Surgery

## 2011-12-16 ENCOUNTER — Other Ambulatory Visit (HOSPITAL_COMMUNITY): Payer: Self-pay | Admitting: Obstetrics and Gynecology

## 2011-12-16 DIAGNOSIS — Z1231 Encounter for screening mammogram for malignant neoplasm of breast: Secondary | ICD-10-CM

## 2012-01-26 ENCOUNTER — Ambulatory Visit (HOSPITAL_COMMUNITY)
Admission: RE | Admit: 2012-01-26 | Discharge: 2012-01-26 | Disposition: A | Discharge status: Home / Self Care | Payer: BC Managed Care – PPO | Source: Ambulatory Visit | Attending: Obstetrics and Gynecology | Admitting: Obstetrics and Gynecology

## 2012-01-26 DIAGNOSIS — Z1231 Encounter for screening mammogram for malignant neoplasm of breast: Secondary | ICD-10-CM | POA: Insufficient documentation

## 2012-05-01 ENCOUNTER — Other Ambulatory Visit: Payer: Self-pay | Admitting: Orthopedic Surgery

## 2012-05-17 ENCOUNTER — Encounter (HOSPITAL_BASED_OUTPATIENT_CLINIC_OR_DEPARTMENT_OTHER): Payer: Self-pay | Admitting: *Deleted

## 2012-05-17 NOTE — Progress Notes (Signed)
Olegario Messier talked with pt while computers were down-to put in info later

## 2012-05-17 NOTE — Progress Notes (Signed)
tammy talked with pt not Morgan Guerra

## 2012-05-18 ENCOUNTER — Other Ambulatory Visit: Payer: Self-pay

## 2012-05-18 ENCOUNTER — Encounter (HOSPITAL_BASED_OUTPATIENT_CLINIC_OR_DEPARTMENT_OTHER)
Admission: RE | Admit: 2012-05-18 | Discharge: 2012-05-18 | Disposition: A | Discharge status: Home / Self Care | Payer: BC Managed Care – PPO | Source: Ambulatory Visit | Attending: Orthopedic Surgery | Admitting: Orthopedic Surgery

## 2012-05-18 LAB — BASIC METABOLIC PANEL
BUN: 15 mg/dL (ref 6–23)
CO2: 25 mEq/L (ref 19–32)
Calcium: 9.6 mg/dL (ref 8.4–10.5)
Chloride: 100 mEq/L (ref 96–112)
Creatinine, Ser: 0.61 mg/dL (ref 0.50–1.10)
GFR calc Af Amer: >90 mL/min (ref >90)
GFR calc non Af Amer: >90 mL/min (ref >90)
Glucose, Bld: 196 mg/dL — ABNORMAL HIGH (ref 70–99)
Potassium: 4.5 mEq/L (ref 3.5–5.1)
Sodium: 136 mEq/L (ref 135–145)

## 2012-05-19 ENCOUNTER — Encounter (HOSPITAL_BASED_OUTPATIENT_CLINIC_OR_DEPARTMENT_OTHER): Payer: Self-pay | Admitting: Orthopedic Surgery

## 2012-05-19 ENCOUNTER — Encounter (HOSPITAL_BASED_OUTPATIENT_CLINIC_OR_DEPARTMENT_OTHER): Payer: Self-pay | Admitting: Certified Registered Nurse Anesthetist

## 2012-05-19 ENCOUNTER — Ambulatory Visit (HOSPITAL_BASED_OUTPATIENT_CLINIC_OR_DEPARTMENT_OTHER)
Admission: RE | Admit: 2012-05-19 | Discharge: 2012-05-19 | Disposition: A | Discharge status: Home / Self Care | Payer: BC Managed Care – PPO | Source: Ambulatory Visit | Attending: Orthopedic Surgery | Admitting: Orthopedic Surgery

## 2012-05-19 ENCOUNTER — Ambulatory Visit (HOSPITAL_BASED_OUTPATIENT_CLINIC_OR_DEPARTMENT_OTHER): Payer: BC Managed Care – PPO | Admitting: Certified Registered Nurse Anesthetist

## 2012-05-19 ENCOUNTER — Encounter (HOSPITAL_BASED_OUTPATIENT_CLINIC_OR_DEPARTMENT_OTHER): Payer: Self-pay | Admitting: Anesthesiology

## 2012-05-19 ENCOUNTER — Encounter (HOSPITAL_BASED_OUTPATIENT_CLINIC_OR_DEPARTMENT_OTHER)
Admission: RE | Disposition: A | Discharge status: Home / Self Care | Payer: Self-pay | Source: Ambulatory Visit | Attending: Orthopedic Surgery

## 2012-05-19 DIAGNOSIS — Z01812 Encounter for preprocedural laboratory examination: Secondary | ICD-10-CM | POA: Insufficient documentation

## 2012-05-19 DIAGNOSIS — M65849 Other synovitis and tenosynovitis, unspecified hand: Secondary | ICD-10-CM | POA: Insufficient documentation

## 2012-05-19 DIAGNOSIS — I10 Essential (primary) hypertension: Secondary | ICD-10-CM | POA: Insufficient documentation

## 2012-05-19 DIAGNOSIS — M6530 Trigger finger, unspecified finger: Secondary | ICD-10-CM | POA: Insufficient documentation

## 2012-05-19 DIAGNOSIS — Z01810 Encounter for preprocedural cardiovascular examination: Secondary | ICD-10-CM | POA: Insufficient documentation

## 2012-05-19 DIAGNOSIS — E119 Type 2 diabetes mellitus without complications: Secondary | ICD-10-CM | POA: Insufficient documentation

## 2012-05-19 DIAGNOSIS — M65839 Other synovitis and tenosynovitis, unspecified forearm: Secondary | ICD-10-CM | POA: Insufficient documentation

## 2012-05-19 DIAGNOSIS — E7800 Pure hypercholesterolemia, unspecified: Secondary | ICD-10-CM | POA: Insufficient documentation

## 2012-05-19 HISTORY — DX: Major depressive disorder, single episode, unspecified: F32.9

## 2012-05-19 HISTORY — DX: Depression, unspecified: F32.A

## 2012-05-19 HISTORY — PX: TRIGGER FINGER RELEASE: SHX641

## 2012-05-19 HISTORY — DX: Reserved for concepts with insufficient information to code with codable children: IMO0002

## 2012-05-19 LAB — POCT HEMOGLOBIN-HEMACUE: Hemoglobin: 15.5 g/dL — ABNORMAL HIGH (ref 12.0–15.0)

## 2012-05-19 SURGERY — RELEASE, A1 PULLEY, FOR TRIGGER FINGER
Anesthesia: Regional | Site: Finger | Laterality: Right | Wound class: Clean

## 2012-05-19 MED ORDER — FENTANYL CITRATE 0.05 MG/ML IJ SOLN: 50.0000 ug | Freq: Once | INTRAMUSCULAR | Status: DC

## 2012-05-19 MED ORDER — MIDAZOLAM HCL 2 MG/2ML IJ SOLN: 1.0000 mg | INTRAMUSCULAR | Status: DC | PRN

## 2012-05-19 MED ORDER — FENTANYL CITRATE 0.05 MG/ML IJ SOLN
INTRAMUSCULAR | Status: DC | PRN
  Administered 2012-05-19: 100 ug via INTRAVENOUS

## 2012-05-19 MED ORDER — HYDROCODONE-ACETAMINOPHEN 5-325 MG PO TABS: 1.0000 | ORAL_TABLET | Freq: Four times a day (QID) | ORAL | Status: DC | PRN

## 2012-05-19 MED ORDER — PROPOFOL 10 MG/ML IV EMUL
INTRAVENOUS | Status: DC | PRN
  Administered 2012-05-19: 100 ug/kg/min via INTRAVENOUS

## 2012-05-19 MED ORDER — HYDROMORPHONE HCL PF 1 MG/ML IJ SOLN: 0.2500 mg | INTRAMUSCULAR | Status: DC | PRN

## 2012-05-19 MED ORDER — OXYCODONE HCL 5 MG PO TABS: 5.0000 mg | ORAL_TABLET | Freq: Once | ORAL | Status: DC | PRN

## 2012-05-19 MED ORDER — MIDAZOLAM HCL 5 MG/5ML IJ SOLN
INTRAMUSCULAR | Status: DC | PRN
  Administered 2012-05-19: 2 mg via INTRAVENOUS

## 2012-05-19 MED ORDER — CHLORHEXIDINE GLUCONATE 4 % EX LIQD: 60.0000 mL | Freq: Once | CUTANEOUS | Status: DC

## 2012-05-19 MED ORDER — LACTATED RINGERS IV SOLN
INTRAVENOUS | Status: DC
  Administered 2012-05-19: 11:00:00 via INTRAVENOUS

## 2012-05-19 MED ORDER — OXYCODONE HCL 5 MG/5ML PO SOLN: 5.0000 mg | Freq: Once | ORAL | Status: DC | PRN

## 2012-05-19 MED ORDER — PROMETHAZINE HCL 25 MG/ML IJ SOLN: 6.2500 mg | INTRAMUSCULAR | Status: DC | PRN

## 2012-05-19 MED ORDER — CEFAZOLIN SODIUM-DEXTROSE 2-3 GM-% IV SOLR: 2.0000 g | INTRAVENOUS | Status: DC

## 2012-05-19 MED ORDER — BUPIVACAINE HCL (PF) 0.25 % IJ SOLN
INTRAMUSCULAR | Status: DC | PRN
  Administered 2012-05-19: 5 mL

## 2012-05-19 SURGICAL SUPPLY — 34 items
BANDAGE COBAN STERILE 2 (GAUZE/BANDAGES/DRESSINGS) ×2 IMPLANT
BLADE SURG 15 STRL LF DISP TIS (BLADE) ×1 IMPLANT
BLADE SURG 15 STRL SS (BLADE) ×2
BNDG CMPR 9X4 STRL LF SNTH (GAUZE/BANDAGES/DRESSINGS)
BNDG ESMARK 4X9 LF (GAUZE/BANDAGES/DRESSINGS) IMPLANT
CHLORAPREP W/TINT 26ML (MISCELLANEOUS) ×2 IMPLANT
CLOTH BEACON ORANGE TIMEOUT ST (SAFETY) ×2 IMPLANT
CORDS BIPOLAR (ELECTRODE) ×1 IMPLANT
COVER MAYO STAND STRL (DRAPES) ×2 IMPLANT
COVER TABLE BACK 60X90 (DRAPES) ×2 IMPLANT
CUFF TOURNIQUET SINGLE 18IN (TOURNIQUET CUFF) ×1 IMPLANT
DECANTER SPIKE VIAL GLASS SM (MISCELLANEOUS) ×1 IMPLANT
DRAPE EXTREMITY T 121X128X90 (DRAPE) ×2 IMPLANT
DRAPE SURG 17X23 STRL (DRAPES) ×2 IMPLANT
GAUZE XEROFORM 1X8 LF (GAUZE/BANDAGES/DRESSINGS) ×2 IMPLANT
GLOVE BIO SURGEON STRL SZ 6.5 (GLOVE) ×2 IMPLANT
GLOVE BIOGEL PI IND STRL 8.5 (GLOVE) ×1 IMPLANT
GLOVE BIOGEL PI INDICATOR 8.5 (GLOVE) ×1
GLOVE SURG ORTHO 8.0 STRL STRW (GLOVE) ×2 IMPLANT
GOWN BRE IMP PREV XXLGXLNG (GOWN DISPOSABLE) ×2 IMPLANT
GOWN PREVENTION PLUS XLARGE (GOWN DISPOSABLE) ×2 IMPLANT
NEEDLE 27GAX1X1/2 (NEEDLE) IMPLANT
NS IRRIG 1000ML POUR BTL (IV SOLUTION) ×2 IMPLANT
PACK BASIN DAY SURGERY FS (CUSTOM PROCEDURE TRAY) ×2 IMPLANT
PADDING CAST ABS 4INX4YD NS (CAST SUPPLIES) ×1
PADDING CAST ABS COTTON 4X4 ST (CAST SUPPLIES) ×1 IMPLANT
SPONGE GAUZE 4X4 12PLY (GAUZE/BANDAGES/DRESSINGS) ×2 IMPLANT
STOCKINETTE 4X48 STRL (DRAPES) ×2 IMPLANT
SUT VICRYL RAPIDE 4/0 PS 2 (SUTURE) ×2 IMPLANT
SYR BULB 3OZ (MISCELLANEOUS) ×2 IMPLANT
SYR CONTROL 10ML LL (SYRINGE) IMPLANT
TOWEL OR 17X24 6PK STRL BLUE (TOWEL DISPOSABLE) ×4 IMPLANT
UNDERPAD 30X30 INCONTINENT (UNDERPADS AND DIAPERS) ×2 IMPLANT
WATER STERILE IRR 1000ML POUR (IV SOLUTION) ×1 IMPLANT

## 2012-05-19 NOTE — Transfer of Care (Signed)
Immediate Anesthesia Transfer of Care Note  Patient: Morgan Guerra  Procedure(s) Performed: Procedure(s) (LRB) with comments: RELEASE TRIGGER FINGER/A-1 PULLEY (Right) - right middle finger   Patient Location: PACU  Anesthesia Type:Bier block  Level of Consciousness: awake, alert  and oriented  Airway & Oxygen Therapy: Patient Spontanous Breathing and Patient connected to face mask oxygen  Post-op Assessment: Report given to PACU RN and Post -op Vital signs reviewed and stable  Post vital signs: Reviewed and stable  Complications: No apparent anesthesia complications

## 2012-05-19 NOTE — H&P (Signed)
Morgan Guerra is a 56 year-old right-hand dominant female who comes in complaining of catching of her right middle finger. This has been going on for at least two months. She has developed some flexion deformity to her PIP joint.  She has not had any treatment for it at the present time. 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 She has had multiple digits with triggering necessitating release. She is diabetic. She has no history of thyroid problems. She has had carpal tunnel release in the past.   PAST MEDICAL HISTORY:  She has no allergies. She is on lisinopril, HCTZ, Effexor, Janumet, simvastatin.  She has had carpal tunnel releases, tubal ligation, multiple trigger finger releases.   FAMILY MEDICAL HISTORY:  Positive for diabetes, heart disease, high blood pressure, arthritis.  SOCIAL HISTORY:  She does not smoke or drink. She is married.   REVIEW OF SYSTEMS:    Positive for glasses, high blood pressure, otherwise negative. Morgan Guerra is an 56 y.o. female.   Chief Complaint: sts RMF HPI: see above  Past Medical History  Diagnosis Date  . Anxiety   . Trigger thumb of right hand 06/2011  . Hypertension     under control; has been on med. x "years"  . High cholesterol   . Diabetes mellitus     NIDDM  . Depression   . Trigger finger of both hands     Past Surgical History  Procedure Date  . Carpal tunnel release     right  . Tubal ligation 1980s  . Trigger finger release 05/18/2011    left middle and small fingers  . Trigger finger release 01/24/2009    left ring  . Trigger finger release 07/03/2009    right ring and little fingers  . Trigger finger release 07/02/2011    Procedure: RELEASE TRIGGER FINGER/A-1 PULLEY;  Surgeon: Nicki Reaper, MD;  Location: [facility];  Service: Orthopedics;  Laterality: Right;  right thumb    Family History  Problem Relation Age of Onset  . Anesthesia  problems Sister     hard to wake up post-op; has polio   Social History:  reports that she has never smoked. She has never used smokeless tobacco. She reports that she does not drink alcohol or use illicit drugs.  Allergies: No Known Allergies  No prescriptions prior to admission    Results for orders placed during the hospital encounter of 05/19/12 (from the past 48 hour(s))  BASIC METABOLIC PANEL     Status: Abnormal   Collection Time   05/18/12  8:30 AM      Component Value Range Comment   Sodium 136  135 - 145 mEq/L    Potassium 4.5  3.5 - 5.1 mEq/L    Chloride 100  96 - 112 mEq/L    CO2 25  19 - 32 mEq/L    Glucose, Bld 196 (*) 70 - 99 mg/dL    BUN 15  6 - 23 mg/dL    Creatinine, Ser 9.56  0.50 - 1.10 mg/dL    Calcium 9.6  8.4 - 21.3 mg/dL    GFR calc non Af Amer >90  >90 mL/min    GFR calc Af Amer >90  >90 mL/min     No results found.   Pertinent items are noted in HPI.  Height 5\' 6"  (1.676 m), weight 99.791 kg (220 lb).  General appearance: alert,  cooperative and appears stated age Head: Normocephalic, without obvious abnormality Neck: no adenopathy Resp: clear to auscultation bilaterally Cardio: regular rate and rhythm, S1, S2 normal, no murmur, click, rub or gallop GI: soft, non-tender; bowel sounds normal; no masses,  no organomegaly Extremities: extremities normal, atraumatic, no cyanosis or edema Pulses: 2+ and symmetric Skin: Skin color, texture, turgor normal. No rashes or lesions Neurologic: Grossly normal Incision/Wound: na  Assessment/Plan Morgan Guerra is a 56 year-old right-hand dominant female who comes in complaining of catching of her right middle finger. This has been going on for at least two months. She has developed some flexion deformity to her PIP joint.  She has not had any treatment for it at the present time. 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 She has had  multiple digits with triggering necessitating release. She is diabetic. She has no history of thyroid problems. She has had carpal tunnel release in the past.   PAST MEDICAL HISTORY:  She has no allergies. She is on lisinopril, HCTZ, Effexor, Janumet, simvastatin.  She has had carpal tunnel releases, tubal ligation, multiple trigger finger releases.   FAMILY MEDICAL HISTORY:  Positive for diabetes, heart disease, high blood pressure, arthritis.  SOCIAL HISTORY:  She does not smoke or drink. She is married.   REVIEW OF SYSTEMS:    Positive for glasses, high blood pressure, otherwise negative.  Morgan Guerra 05/19/2012, 10:06 AM

## 2012-05-19 NOTE — Op Note (Signed)
Dictation Number [id number]

## 2012-05-19 NOTE — Anesthesia Postprocedure Evaluation (Signed)
  Anesthesia Post-op Note  Patient: Morgan Guerra  Procedure(s) Performed: Procedure(s) (LRB) with comments: RELEASE TRIGGER FINGER/A-1 PULLEY (Right) - right middle finger   Patient Location: PACU  Anesthesia Type:MAC and Bier block  Level of Consciousness: awake and alert   Airway and Oxygen Therapy: Patient Spontanous Breathing  Post-op Pain: mild  Post-op Assessment: Post-op Vital signs reviewed, Patient's Cardiovascular Status Stable, Respiratory Function Stable, Patent Airway, No signs of Nausea or vomiting and Pain level controlled  Post-op Vital Signs: stable  Complications: No apparent anesthesia complications

## 2012-05-19 NOTE — Anesthesia Preprocedure Evaluation (Signed)
Anesthesia Evaluation  Patient identified by MRN, date of birth, ID band Patient awake    Reviewed: Allergy & Precautions, H&P , NPO status , Patient's Chart, lab work & pertinent test results, reviewed documented beta blocker date and time   Airway Mallampati: II TM Distance: >3 FB Neck ROM: full    Dental   Pulmonary neg pulmonary ROS,  breath sounds clear to auscultation        Cardiovascular hypertension, On Medications Rhythm:Regular Rate:Normal     Neuro/Psych Anxiety Depression  Neuromuscular disease negative neurological ROS  negative psych ROS   GI/Hepatic negative GI ROS, Neg liver ROS,   Endo/Other  diabetes  Renal/GU negative Renal ROS  negative genitourinary   Musculoskeletal   Abdominal (+) + obese,   Peds  Hematology negative hematology ROS (+)   Anesthesia Other Findings See surgeon's H&P   Reproductive/Obstetrics negative OB ROS                           Anesthesia Physical Anesthesia Plan  ASA: III  Anesthesia Plan: Bier Block   Post-op Pain Management:    Induction: Intravenous  Airway Management Planned: Nasal Cannula  Additional Equipment:   Intra-op Plan:   Post-operative Plan:   Informed Consent: I have reviewed the patients History and Physical, chart, labs and discussed the procedure including the risks, benefits and alternatives for the proposed anesthesia with the patient or authorized representative who has indicated his/her understanding and acceptance.     Plan Discussed with: CRNA and Surgeon  Anesthesia Plan Comments:         Anesthesia Quick Evaluation

## 2012-05-19 NOTE — Brief Op Note (Signed)
05/19/2012  1:13 PM  PATIENT:  Morgan Guerra  56 y.o. female  PRE-OPERATIVE DIAGNOSIS:  trigger  right middle finger   POST-OPERATIVE DIAGNOSIS:  trigger finger right middle  PROCEDURE:  Procedure(s) (LRB) with comments: RELEASE TRIGGER FINGER/A-1 PULLEY (Right) - right middle finger   SURGEON:  Surgeon(s) and Role:    * Nicki Reaper, MD - Primary  PHYSICIAN ASSISTANT:   ASSISTANTS: none   ANESTHESIA:   local and regional  EBL:  Total I/O In: 700 [I.V.:700] Out: -   BLOOD ADMINISTERED:none  DRAINS: none   LOCAL MEDICATIONS USED:  MARCAINE     SPECIMEN:  No Specimen  DISPOSITION OF SPECIMEN:  N/A  COUNTS:  YES  TOURNIQUET:   Total Tourniquet Time Documented: Forearm (Right) - 16 minutes  DICTATION: .Other Dictation: Dictation Number [id number]  PLAN OF CARE: Discharge to home after PACU  PATIENT DISPOSITION:  PACU - hemodynamically stable.

## 2012-05-22 ENCOUNTER — Encounter (HOSPITAL_BASED_OUTPATIENT_CLINIC_OR_DEPARTMENT_OTHER): Payer: Self-pay | Admitting: Orthopedic Surgery

## 2012-05-22 LAB — GLUCOSE, CAPILLARY
Glucose-Capillary: 144 mg/dL — ABNORMAL HIGH (ref 70–99)
Glucose-Capillary: 126 mg/dL — ABNORMAL HIGH (ref 70–99)

## 2012-05-22 NOTE — Op Note (Signed)
Morgan Guerra, VITALE NO.:  [id number]  MEDICAL RECORD NO.:  [id number]  LOCATION:                                 FACILITY:  PHYSICIAN:  Cindee Salt, M.D.       DATE OF BIRTH:  04-24-1956  DATE OF PROCEDURE:  05/19/2012 DATE OF DISCHARGE:                              OPERATIVE REPORT   PREOPERATIVE DIAGNOSIS:  Stenosing tenosynovitis, right middle finger.  POSTOPERATIVE DIAGNOSIS:  Stenosing tenosynovitis, right middle finger.  OPERATION:  Release of A1 pulley, right middle finger.  SURGEON:  Cindee Salt, MD  ANESTHESIA:  Forearm-based IV regional with local infiltration.  ANESTHESIOLOGIST:  Bedelia Person, M.D.  HISTORY:  The patient is a 56 year old female with a history of triggering of her right middle finger.  This has not responded to conservative treatment.  She has elected to undergo surgical decompression to the A1 pulley.  Pre, peri, and postoperative course have been discussed along with risks and complications.  She is aware that there is no guarantee with the surgery, possibility of infection, recurrence of injury to arteries, nerves, and tendons, incomplete relief of symptoms and dystrophy.  In the preoperative area the patient is seen, the extremity marked by both the patient and surgeon.  Antibiotic given.  PROCEDURE:  The patient was brought to the operating room where a forearm-based IV regional anesthetic was carried out without difficulty. She was prepped using ChloraPrep, supine position with the right arm free.  A 3-minute dry time was allowed.  Time-out taken confirming the patient and procedure.  An oblique incision was made over the A1 pulley of the right middle finger and carried down through subcutaneous tissue. A very thickened pulley was immediately encountered at the radial aspect after retractors were placed protecting the radial neurovascular bundles and electrocauterized with bleeders for a visualization.  The A1 pulley was  released on its radial aspect.  A small incision was made centrally in A2.  Finger placed through a full range motion, no further triggering was noted.  Partial tenosynovectomy was performed proximally.  The wound was copiously irrigated with saline.  The skin closed with interrupted 4- 0 Vicryl Rapide sutures.  Local infiltration with 0.25% Marcaine without epinephrine was given, approximately 4 mL was used.  A sterile compressive dressing to the hand with fingers free was applied.  On deflation of the tourniquet, all fingers immediately pinked.  She was taken to the recovery room for observation in satisfactory condition.  She will be discharged home to return to the [facility] [place] in 1 week on Vicodin.          ______________________________ Cindee Salt, M.D.     GK/MEDQ  D:  05/19/2012  T:  05/20/2012  Job:  [id number]

## 2012-12-28 ENCOUNTER — Other Ambulatory Visit: Payer: Self-pay | Admitting: Family Medicine

## 2012-12-28 DIAGNOSIS — R748 Abnormal levels of other serum enzymes: Secondary | ICD-10-CM

## 2013-01-03 ENCOUNTER — Ambulatory Visit
Admission: RE | Admit: 2013-01-03 | Discharge: 2013-01-03 | Disposition: A | Discharge status: Home / Self Care | Payer: BC Managed Care – PPO | Source: Ambulatory Visit | Attending: Family Medicine | Admitting: Family Medicine

## 2013-01-03 DIAGNOSIS — R748 Abnormal levels of other serum enzymes: Secondary | ICD-10-CM

## 2013-01-31 ENCOUNTER — Other Ambulatory Visit (HOSPITAL_COMMUNITY): Payer: Self-pay | Admitting: Obstetrics and Gynecology

## 2013-01-31 DIAGNOSIS — Z1231 Encounter for screening mammogram for malignant neoplasm of breast: Secondary | ICD-10-CM

## 2013-02-09 ENCOUNTER — Ambulatory Visit (HOSPITAL_COMMUNITY)
Admission: RE | Admit: 2013-02-09 | Discharge: 2013-02-09 | Disposition: A | Discharge status: Home / Self Care | Payer: BC Managed Care – PPO | Source: Ambulatory Visit | Attending: Obstetrics and Gynecology | Admitting: Obstetrics and Gynecology

## 2013-02-09 DIAGNOSIS — Z1231 Encounter for screening mammogram for malignant neoplasm of breast: Secondary | ICD-10-CM | POA: Insufficient documentation

## 2014-01-11 ENCOUNTER — Other Ambulatory Visit (HOSPITAL_COMMUNITY): Payer: Self-pay | Admitting: Family Medicine

## 2014-01-11 DIAGNOSIS — Z1231 Encounter for screening mammogram for malignant neoplasm of breast: Secondary | ICD-10-CM

## 2014-02-14 ENCOUNTER — Ambulatory Visit (HOSPITAL_COMMUNITY)
Admission: RE | Admit: 2014-02-14 | Discharge: 2014-02-14 | Disposition: A | Discharge status: Home / Self Care | Payer: BC Managed Care – PPO | Source: Ambulatory Visit | Attending: Family Medicine | Admitting: Family Medicine

## 2014-02-14 DIAGNOSIS — Z1231 Encounter for screening mammogram for malignant neoplasm of breast: Secondary | ICD-10-CM

## 2015-03-26 ENCOUNTER — Other Ambulatory Visit (HOSPITAL_COMMUNITY): Payer: Self-pay | Admitting: Family Medicine

## 2015-03-26 DIAGNOSIS — Z1231 Encounter for screening mammogram for malignant neoplasm of breast: Secondary | ICD-10-CM

## 2016-03-31 ENCOUNTER — Other Ambulatory Visit: Payer: Self-pay | Admitting: Orthopedic Surgery

## 2016-04-06 ENCOUNTER — Encounter (HOSPITAL_BASED_OUTPATIENT_CLINIC_OR_DEPARTMENT_OTHER): Payer: Self-pay | Admitting: *Deleted

## 2016-04-12 ENCOUNTER — Other Ambulatory Visit: Payer: Self-pay

## 2016-04-12 ENCOUNTER — Encounter (HOSPITAL_BASED_OUTPATIENT_CLINIC_OR_DEPARTMENT_OTHER)
Admission: RE | Admit: 2016-04-12 | Discharge: 2016-04-12 | Disposition: A | Discharge status: Home / Self Care | Payer: BLUE CROSS/BLUE SHIELD | Source: Ambulatory Visit | Attending: Orthopedic Surgery | Admitting: Orthopedic Surgery

## 2016-04-12 DIAGNOSIS — M65321 Trigger finger, right index finger: Secondary | ICD-10-CM | POA: Diagnosis present

## 2016-04-12 DIAGNOSIS — I10 Essential (primary) hypertension: Secondary | ICD-10-CM | POA: Diagnosis not present

## 2016-04-12 DIAGNOSIS — Z794 Long term (current) use of insulin: Secondary | ICD-10-CM | POA: Diagnosis not present

## 2016-04-12 DIAGNOSIS — E119 Type 2 diabetes mellitus without complications: Secondary | ICD-10-CM | POA: Diagnosis not present

## 2016-04-12 LAB — BASIC METABOLIC PANEL
Anion gap: 9 (ref 5–15)
BUN: 14 mg/dL (ref 6–20)
CO2: 26 mmol/L (ref 22–32)
Calcium: 9.4 mg/dL (ref 8.9–10.3)
Chloride: 105 mmol/L (ref 101–111)
Creatinine, Ser: 0.63 mg/dL (ref 0.44–1.00)
GFR calc Af Amer: >60 mL/min (ref >60)
GFR calc non Af Amer: >60 mL/min (ref >60)
Glucose, Bld: 159 mg/dL — ABNORMAL HIGH (ref 65–99)
Potassium: 4.1 mmol/L (ref 3.5–5.1)
Sodium: 140 mmol/L (ref 135–145)

## 2016-04-13 ENCOUNTER — Encounter (HOSPITAL_BASED_OUTPATIENT_CLINIC_OR_DEPARTMENT_OTHER)
Admission: RE | Disposition: A | Discharge status: Home / Self Care | Payer: Self-pay | Source: Ambulatory Visit | Attending: Orthopedic Surgery

## 2016-04-13 ENCOUNTER — Encounter (HOSPITAL_BASED_OUTPATIENT_CLINIC_OR_DEPARTMENT_OTHER): Payer: Self-pay | Admitting: Anesthesiology

## 2016-04-13 ENCOUNTER — Ambulatory Visit (HOSPITAL_BASED_OUTPATIENT_CLINIC_OR_DEPARTMENT_OTHER): Payer: BLUE CROSS/BLUE SHIELD | Admitting: Anesthesiology

## 2016-04-13 ENCOUNTER — Ambulatory Visit (HOSPITAL_BASED_OUTPATIENT_CLINIC_OR_DEPARTMENT_OTHER)
Admission: RE | Admit: 2016-04-13 | Discharge: 2016-04-13 | Disposition: A | Discharge status: Home / Self Care | Payer: BLUE CROSS/BLUE SHIELD | Source: Ambulatory Visit | Attending: Orthopedic Surgery | Admitting: Orthopedic Surgery

## 2016-04-13 DIAGNOSIS — Z794 Long term (current) use of insulin: Secondary | ICD-10-CM | POA: Insufficient documentation

## 2016-04-13 DIAGNOSIS — E119 Type 2 diabetes mellitus without complications: Secondary | ICD-10-CM | POA: Insufficient documentation

## 2016-04-13 DIAGNOSIS — I10 Essential (primary) hypertension: Secondary | ICD-10-CM | POA: Insufficient documentation

## 2016-04-13 DIAGNOSIS — M65321 Trigger finger, right index finger: Secondary | ICD-10-CM | POA: Diagnosis not present

## 2016-04-13 HISTORY — PX: TRIGGER FINGER RELEASE: SHX641

## 2016-04-13 LAB — GLUCOSE, CAPILLARY
Glucose-Capillary: 121 mg/dL — ABNORMAL HIGH (ref 65–99)
Glucose-Capillary: 102 mg/dL — ABNORMAL HIGH (ref 65–99)

## 2016-04-13 SURGERY — RELEASE, A1 PULLEY, FOR TRIGGER FINGER
Anesthesia: Monitor Anesthesia Care | Site: Finger | Laterality: Right

## 2016-04-13 MED ORDER — LACTATED RINGERS IV SOLN
INTRAVENOUS | Status: DC
  Administered 2016-04-13 (×2): via INTRAVENOUS

## 2016-04-13 MED ORDER — FENTANYL CITRATE (PF) 100 MCG/2ML IJ SOLN
50.0000 ug | INTRAMUSCULAR | Status: DC | PRN
  Administered 2016-04-13 (×2): 50 ug via INTRAVENOUS

## 2016-04-13 MED ORDER — MIDAZOLAM HCL 2 MG/2ML IJ SOLN: 1.0000 mg | INTRAMUSCULAR | Status: DC | PRN

## 2016-04-13 MED ORDER — CEFAZOLIN SODIUM-DEXTROSE 2-4 GM/100ML-% IV SOLN
INTRAVENOUS | Status: AC
  Filled 2016-04-13: qty 100

## 2016-04-13 MED ORDER — CHLORHEXIDINE GLUCONATE 4 % EX LIQD: 60.0000 mL | Freq: Once | CUTANEOUS | Status: DC

## 2016-04-13 MED ORDER — PROPOFOL 10 MG/ML IV BOLUS
INTRAVENOUS | Status: AC
  Filled 2016-04-13: qty 20

## 2016-04-13 MED ORDER — ONDANSETRON HCL 4 MG/2ML IJ SOLN
INTRAMUSCULAR | Status: AC
  Filled 2016-04-13: qty 2

## 2016-04-13 MED ORDER — SCOPOLAMINE 1 MG/3DAYS TD PT72: 1.0000 | MEDICATED_PATCH | Freq: Once | TRANSDERMAL | Status: DC | PRN

## 2016-04-13 MED ORDER — BUPIVACAINE HCL (PF) 0.25 % IJ SOLN
INTRAMUSCULAR | Status: DC | PRN
  Administered 2016-04-13: 6 mL

## 2016-04-13 MED ORDER — CEFAZOLIN SODIUM-DEXTROSE 2-4 GM/100ML-% IV SOLN
2.0000 g | INTRAVENOUS | Status: AC
  Administered 2016-04-13: 2 g via INTRAVENOUS

## 2016-04-13 MED ORDER — FENTANYL CITRATE (PF) 100 MCG/2ML IJ SOLN
INTRAMUSCULAR | Status: AC
  Filled 2016-04-13: qty 2

## 2016-04-13 MED ORDER — MIDAZOLAM HCL 5 MG/5ML IJ SOLN
INTRAMUSCULAR | Status: DC | PRN
  Administered 2016-04-13: 2 mg via INTRAVENOUS

## 2016-04-13 MED ORDER — ONDANSETRON HCL 4 MG/2ML IJ SOLN
INTRAMUSCULAR | Status: DC | PRN
  Administered 2016-04-13: 4 mg via INTRAVENOUS

## 2016-04-13 MED ORDER — MIDAZOLAM HCL 2 MG/2ML IJ SOLN
INTRAMUSCULAR | Status: AC
  Filled 2016-04-13: qty 2

## 2016-04-13 MED ORDER — GLYCOPYRROLATE 0.2 MG/ML IJ SOLN
0.2000 mg | Freq: Once | INTRAMUSCULAR | Status: DC | PRN
Start: 2016-04-13 — End: 2016-04-13

## 2016-04-13 MED ORDER — HYDROCODONE-ACETAMINOPHEN 5-325 MG PO TABS
1.0000 | ORAL_TABLET | Freq: Four times a day (QID) | ORAL | 0 refills | Status: DC | PRN
Start: 2016-04-13 — End: 2020-09-18

## 2016-04-13 SURGICAL SUPPLY — 34 items
BANDAGE COBAN STERILE 2 (GAUZE/BANDAGES/DRESSINGS) ×2 IMPLANT
BLADE SURG 15 STRL LF DISP TIS (BLADE) ×1 IMPLANT
BLADE SURG 15 STRL SS (BLADE) ×2
BNDG CMPR 9X4 STRL LF SNTH (GAUZE/BANDAGES/DRESSINGS) ×1
BNDG ESMARK 4X9 LF (GAUZE/BANDAGES/DRESSINGS) ×1 IMPLANT
CHLORAPREP W/TINT 26ML (MISCELLANEOUS) ×2 IMPLANT
CORDS BIPOLAR (ELECTRODE) IMPLANT
COVER BACK TABLE 60X90IN (DRAPES) ×2 IMPLANT
COVER MAYO STAND STRL (DRAPES) ×2 IMPLANT
CUFF TOURNIQUET SINGLE 18IN (TOURNIQUET CUFF) ×1 IMPLANT
DECANTER SPIKE VIAL GLASS SM (MISCELLANEOUS) IMPLANT
DRAPE EXTREMITY T 121X128X90 (DRAPE) ×2 IMPLANT
DRAPE SURG 17X23 STRL (DRAPES) ×2 IMPLANT
GAUZE SPONGE 4X4 12PLY STRL (GAUZE/BANDAGES/DRESSINGS) ×2 IMPLANT
GAUZE XEROFORM 1X8 LF (GAUZE/BANDAGES/DRESSINGS) ×2 IMPLANT
GLOVE BIOGEL PI IND STRL 7.0 (GLOVE) IMPLANT
GLOVE BIOGEL PI IND STRL 8.5 (GLOVE) ×1 IMPLANT
GLOVE BIOGEL PI INDICATOR 7.0 (GLOVE) ×2
GLOVE BIOGEL PI INDICATOR 8.5 (GLOVE) ×1
GLOVE ECLIPSE 6.5 STRL STRAW (GLOVE) ×1 IMPLANT
GLOVE SURG ORTHO 8.0 STRL STRW (GLOVE) ×2 IMPLANT
GOWN STRL REUS W/ TWL LRG LVL3 (GOWN DISPOSABLE) ×1 IMPLANT
GOWN STRL REUS W/TWL LRG LVL3 (GOWN DISPOSABLE) ×2
GOWN STRL REUS W/TWL XL LVL3 (GOWN DISPOSABLE) ×2 IMPLANT
NDL PRECISIONGLIDE 27X1.5 (NEEDLE) ×1 IMPLANT
NEEDLE PRECISIONGLIDE 27X1.5 (NEEDLE) ×2 IMPLANT
NS IRRIG 1000ML POUR BTL (IV SOLUTION) ×2 IMPLANT
PACK BASIN DAY SURGERY FS (CUSTOM PROCEDURE TRAY) ×2 IMPLANT
STOCKINETTE 4X48 STRL (DRAPES) ×2 IMPLANT
SUT ETHILON 4 0 PS 2 18 (SUTURE) ×4 IMPLANT
SYR BULB 3OZ (MISCELLANEOUS) ×2 IMPLANT
SYR CONTROL 10ML LL (SYRINGE) ×2 IMPLANT
TOWEL OR 17X24 6PK STRL BLUE (TOWEL DISPOSABLE) ×3 IMPLANT
UNDERPAD 30X30 (UNDERPADS AND DIAPERS) ×2 IMPLANT

## 2016-04-13 NOTE — Brief Op Note (Signed)
04/13/2016  10:18 AM  PATIENT:  Morgan GarnerSusan M Guerra  60 y.o. female  PRE-OPERATIVE DIAGNOSIS:  trigger right index finger  POST-OPERATIVE DIAGNOSIS:  trigger right index finger  PROCEDURE:  Procedure(s) with comments: RELEASE TRIGGER FINGER/A-1 PULLEY, right index finger (Right) - FAB  SURGEON:  Surgeon(s) and Role:    * Cindee SaltGary Jordynne Mccown, MD - Primary  PHYSICIAN ASSISTANT:   ASSISTANTS: none   ANESTHESIA:   local and regional  EBL:  Total I/O In: 500 [I.V.:500] Out: 2 [Blood:2]  BLOOD ADMINISTERED:none  DRAINS: none   LOCAL MEDICATIONS USED:  BUPIVICAINE   SPECIMEN:  No Specimen  DISPOSITION OF SPECIMEN:  N/A  COUNTS:  YES  TOURNIQUET:   Total Tourniquet Time Documented: Forearm (Right) - 18 minutes Total: Forearm (Right) - 18 minutes   DICTATION: .Other Dictation: Dictation Number [id number]  PLAN OF CARE: Discharge to home after PACU  PATIENT DISPOSITION:  PACU - hemodynamically stable.

## 2016-04-13 NOTE — Discharge Instructions (Signed)

## 2016-04-13 NOTE — Anesthesia Procedure Notes (Signed)
Procedure Name: MAC Date/Time: 04/13/2016 9:56 AM Performed by: [place] DesanctisLINKA, Lira Stephen L Pre-anesthesia Checklist: Patient identified, Timeout performed, Emergency Drugs available, Suction available and Patient being monitored Patient Re-evaluated:Patient Re-evaluated prior to inductionOxygen Delivery Method: Simple face mask Preoxygenation: Pre-oxygenation with 100% oxygen

## 2016-04-13 NOTE — Anesthesia Preprocedure Evaluation (Signed)
Anesthesia Evaluation  Patient identified by MRN, date of birth, ID band Patient awake    Reviewed: Allergy & Precautions, NPO status , Patient's Chart, lab work & pertinent test results  Airway Mallampati: II  TM Distance: >3 FB Neck ROM: Full    Dental   Pulmonary    breath sounds clear to auscultation       Cardiovascular hypertension, Pt. on medications  Rhythm:Regular Rate:Normal     Neuro/Psych Anxiety Depression    GI/Hepatic negative GI ROS, Neg liver ROS,   Endo/Other  diabetes, Type 2, Insulin Dependent  Renal/GU negative Renal ROS     Musculoskeletal   Abdominal   Peds  Hematology negative hematology ROS (+)   Anesthesia Other Findings   Reproductive/Obstetrics                             Anesthesia Physical Anesthesia Plan  ASA: II  Anesthesia Plan: MAC and Bier Block   Post-op Pain Management:    Induction:   Airway Management Planned: Natural Airway and Simple Face Mask  Additional Equipment:   Intra-op Plan:   Post-operative Plan:   Informed Consent: I have reviewed the patients History and Physical, chart, labs and discussed the procedure including the risks, benefits and alternatives for the proposed anesthesia with the patient or authorized representative who has indicated his/her understanding and acceptance.     Plan Discussed with: CRNA  Anesthesia Plan Comments:         Anesthesia Quick Evaluation

## 2016-04-13 NOTE — Op Note (Signed)
ODictation Number [id number]

## 2016-04-13 NOTE — Anesthesia Postprocedure Evaluation (Signed)
Anesthesia Post Note  Patient: Morgan GarnerSusan M Guerra  Procedure(s) Performed: Procedure(s) (LRB): RELEASE TRIGGER FINGER/A-1 PULLEY, right index finger (Right)  Patient location during evaluation: PACU Anesthesia Type: MAC and Bier Block Level of consciousness: awake and alert Pain management: pain level controlled Vital Signs Assessment: post-procedure vital signs reviewed and stable Respiratory status: spontaneous breathing, nonlabored ventilation, respiratory function stable and patient connected to nasal cannula oxygen Cardiovascular status: stable and blood pressure returned to baseline Anesthetic complications: no    Last Vitals:  Vitals:   04/13/16 1100 04/13/16 1130  BP: (!) 112/52 119/69  Pulse: 91 85  Resp: (!) 22 18  Temp:  36.4 C    Last Pain:  Vitals:   04/13/16 1130  TempSrc:   PainSc: 0-No pain                 Kennieth RadFitzgerald, Jarek Longton E

## 2016-04-13 NOTE — Transfer of Care (Signed)
Immediate Anesthesia Transfer of Care Note  Patient: Jhonnie GarnerSusan M Mcinerney  Procedure(s) Performed: Procedure(s) with comments: RELEASE TRIGGER FINGER/A-1 PULLEY, right index finger (Right) - FAB  Patient Location: PACU  Anesthesia Type:Bier block  Level of Consciousness: awake and patient cooperative  Airway & Oxygen Therapy: Patient Spontanous Breathing and Patient connected to face mask oxygen  Post-op Assessment: Report given to RN and Post -op Vital signs reviewed and stable  Post vital signs: Reviewed and stable  Last Vitals:  Vitals:   04/13/16 0840  BP: 125/63  Pulse: 91  Resp: 18  Temp: 36.8 C    Last Pain:  Vitals:   04/13/16 0840  TempSrc: Oral      Patients Stated Pain Goal: 0 (04/13/16 0840)  Complications: No apparent anesthesia complications

## 2016-04-13 NOTE — H&P (Signed)
Morgan Guerra is an 60 y.o. female.   Chief Complaint:catching right index finger HPI: Morgan Guerra is a [age]-year-old right-hand-dominant female who comes in complaining of catching of her right index finger. She was last seen in 2014. This been going on for 2 months. She recalls no specific history of injury. She states is sharp in nature with a VAS score of 7/10. She frequently will have to stop lives out straight. She has not taken anything for this. She has a history of brittle diabetes. She has had multiple trigger digit release by myself in the past.  He states nothing seems to make it better or worse. The discomfort does not radiate elsewhere. Past Medical History:  Diagnosis Date  . Diabetes mellitus type II, controlled ([place])  . Hyperlipidemia  . Hypertension   Past Surgical History:  Procedure Laterality Date  . TUBIAL LIGATION       Past Medical History:  Diagnosis Date  . Anxiety   . Depression   . Diabetes mellitus    NIDDM  . High cholesterol   . Hypertension    under control; has been on med. x "years"  . Trigger finger of both hands   . Trigger thumb of right hand 06/2011    Past Surgical History:  Procedure Laterality Date  . CARPAL TUNNEL RELEASE     right  . TRIGGER FINGER RELEASE  05/18/2011   left middle and small fingers  . TRIGGER FINGER RELEASE  01/24/2009   left ring  . TRIGGER FINGER RELEASE  07/03/2009   right ring and little fingers  . TRIGGER FINGER RELEASE  07/02/2011   Procedure: RELEASE TRIGGER FINGER/A-1 PULLEY;  Surgeon: Wynonia Sours, MD;  Location: [facility];  Service: Orthopedics;  Laterality: Right;  right thumb  . TRIGGER FINGER RELEASE  05/19/2012   Procedure: RELEASE TRIGGER FINGER/A-1 PULLEY;  Surgeon: Wynonia Sours, MD;  Location: [place];  Service: Orthopedics;  Laterality: Right;  right middle finger   . TUBAL LIGATION  1980s    Family History  Problem Relation Age of Onset  . Anesthesia problems  Sister     hard to wake up post-op; has polio   Social History:  reports that she has never smoked. She has never used smokeless tobacco. She reports that she does not drink alcohol or use drugs.  Allergies: No Known Allergies  No prescriptions prior to admission.    Results for orders placed or performed during the hospital encounter of 04/13/16 (from the past 48 hour(s))  Basic metabolic panel     Status: Abnormal   Collection Time: 04/12/16  9:25 AM  Result Value Ref Range   Sodium 140 135 - 145 mmol/L   Potassium 4.1 3.5 - 5.1 mmol/L   Chloride 105 101 - 111 mmol/L   CO2 26 22 - 32 mmol/L   Glucose, Bld 159 (H) 65 - 99 mg/dL   BUN 14 6 - 20 mg/dL   Creatinine, Ser 0.63 0.44 - 1.00 mg/dL   Calcium 9.4 8.9 - 10.3 mg/dL   GFR calc non Af Amer >60 >60 mL/min   GFR calc Af Amer >60 >60 mL/min    Comment: (NOTE) The eGFR has been calculated using the CKD EPI equation. This calculation has not been validated in all clinical situations. eGFR's persistently <60 mL/min signify possible Chronic Kidney Disease.    Anion gap 9 5 - 15    No results found.   Pertinent items  are noted in HPI.  Height '5\' 6"'$  (1.676 m), weight 91.6 kg (202 lb).  General appearance: alert, cooperative and appears stated age Head: Normocephalic, without obvious abnormality Neck: no JVD Resp: clear to auscultation bilaterally Cardio: regular rate and rhythm, S1, S2 normal, no murmur, click, rub or gallop GI: soft, non-tender; bowel sounds normal; no masses,  no organomegaly Extremities: cathing right index finger Pulses: 2+ and symmetric Skin: Skin color, texture, turgor normal. No rashes or lesions Neurologic: Grossly normal Incision/Wound: NA  Assessment/Plan  Assessment:  1. Trigger index finger of right hand    Plan: She is not desirous of injections. She would like to proceed to have this surgically released. Pre peri and postoperative course were discussed along with risks and  complications. She is where there is no guarantee to the surgery the possibility of infection recurrence injury to arteries nerves tendons and incomplete release and symptoms and dystrophy. She is scheduled for release A1 pulley as an outpatient under regional anesthesia right hand. Questions are encouraged and answered to her satisfaction.      Morgan Guerra 04/13/2016, 5:09 AM

## 2016-04-14 NOTE — Op Note (Signed)
NAMWilla Rough:  Guerra, Morgan             ACCOUNT NO.:  [id number]  MEDICAL RECORD NO.:  [id number]  LOCATION:                                 FACILITY:  PHYSICIAN:  Cindee SaltGary Anallely Rosell, M.D.            DATE OF BIRTH:  DATE OF PROCEDURE:  04/13/2016 DATE OF DISCHARGE:                              OPERATIVE REPORT   PREOPERATIVE DIAGNOSIS:  Stenosing tenosynovitis, trigger, right index finger.  POSTOPERATIVE DIAGNOSIS:  Stenosing tenosynovitis, trigger, right index finger.  OPERATION:  Release of A1 pulley, right index finger.  SURGEON:  Cindee SaltGary Mauro Arps, M.D.  ANESTHESIA:  Forearm-based IV regional with local infiltration.  [facility]  HISTORY:  The patient is a 60 year old female with a history of triggering of her right index finger.  This has not responded to conservative treatment.  She has elected to undergo surgical release. She is aware that there was no guarantee to the surgery; the possibility of infection; recurrence of injury to arteries, nerves, tendons; incomplete relief of symptoms and dystrophy.  In the preoperative area, the patient is seen, the extremity marked by both patient and surgeon. Antibiotic was given.  PROCEDURE IN DETAIL:  The patient was brought to the operating room, where a forearm-based IV regional anesthetic was carried out without difficulty.  She was prepped using ChloraPrep in a supine position with the right arm free.  A 3-minute dry-time was allowed and time-out taken, confirming the patient and procedure.  An oblique incision was made over the A1 pulley of the right index finger.  With blunt dissection, this was carried down to the A1 pulley.  Neurovascular structures were identified and protected with retractors radially and ulnarly.  The A1 pulley was found to be markedly thickened.  This was released on its radial aspect.  A small incision was made centrally in A2.  A partial tenosynovectomy was performed proximally.   The two tendons separated to remove any adhesions between the two.  The finger was placed through a full passive range of motion and no further triggering was noted.  The wound was copiously irrigated with saline.  The skin was closed with interrupted 4-0 nylon sutures.  A local infiltration with 0.25% bupivacaine without epinephrine was given.  6 mL was used.  A sterile compressive dressing with the fingers free was applied.  On deflation of the tourniquet, all fingers were immediately pinked.  She was taken to the recovery room for observation in satisfactory condition.  She will be discharged to home to [facility] [place], on [place].          ______________________________ Cindee SaltGary Shamir Tuzzolino, M.D.     GK/MEDQ  D:  04/13/2016  T:  04/14/2016  Job:  [id number]

## 2016-04-15 ENCOUNTER — Encounter (HOSPITAL_BASED_OUTPATIENT_CLINIC_OR_DEPARTMENT_OTHER): Payer: Self-pay | Admitting: Orthopedic Surgery

## 2016-08-02 DIAGNOSIS — L0390 Cellulitis, unspecified: Secondary | ICD-10-CM | POA: Diagnosis not present

## 2016-08-02 DIAGNOSIS — R8290 Unspecified abnormal findings in urine: Secondary | ICD-10-CM | POA: Diagnosis not present

## 2016-08-02 DIAGNOSIS — R3915 Urgency of urination: Secondary | ICD-10-CM | POA: Diagnosis not present

## 2016-10-07 DIAGNOSIS — N3000 Acute cystitis without hematuria: Secondary | ICD-10-CM | POA: Diagnosis not present

## 2016-10-07 DIAGNOSIS — R350 Frequency of micturition: Secondary | ICD-10-CM | POA: Diagnosis not present

## 2016-10-21 DIAGNOSIS — Z1231 Encounter for screening mammogram for malignant neoplasm of breast: Secondary | ICD-10-CM | POA: Diagnosis not present

## 2016-10-21 DIAGNOSIS — Z01419 Encounter for gynecological examination (general) (routine) without abnormal findings: Secondary | ICD-10-CM | POA: Diagnosis not present

## 2016-10-27 DIAGNOSIS — E782 Mixed hyperlipidemia: Secondary | ICD-10-CM | POA: Diagnosis not present

## 2016-10-27 DIAGNOSIS — I10 Essential (primary) hypertension: Secondary | ICD-10-CM | POA: Diagnosis not present

## 2016-10-27 DIAGNOSIS — E1165 Type 2 diabetes mellitus with hyperglycemia: Secondary | ICD-10-CM | POA: Diagnosis not present

## 2016-12-02 DIAGNOSIS — H532 Diplopia: Secondary | ICD-10-CM | POA: Diagnosis not present

## 2016-12-03 ENCOUNTER — Other Ambulatory Visit: Payer: Self-pay | Admitting: Physician Assistant

## 2016-12-03 DIAGNOSIS — H532 Diplopia: Secondary | ICD-10-CM

## 2016-12-06 DIAGNOSIS — Z794 Long term (current) use of insulin: Secondary | ICD-10-CM | POA: Diagnosis not present

## 2016-12-06 DIAGNOSIS — H4921 Sixth [abducent] nerve palsy, right eye: Secondary | ICD-10-CM | POA: Diagnosis not present

## 2016-12-06 DIAGNOSIS — E119 Type 2 diabetes mellitus without complications: Secondary | ICD-10-CM | POA: Diagnosis not present

## 2016-12-12 ENCOUNTER — Other Ambulatory Visit: Payer: BLUE CROSS/BLUE SHIELD

## 2016-12-24 DIAGNOSIS — R300 Dysuria: Secondary | ICD-10-CM | POA: Diagnosis not present

## 2017-01-24 DIAGNOSIS — H524 Presbyopia: Secondary | ICD-10-CM | POA: Diagnosis not present

## 2017-01-24 DIAGNOSIS — Z794 Long term (current) use of insulin: Secondary | ICD-10-CM | POA: Diagnosis not present

## 2017-01-24 DIAGNOSIS — E119 Type 2 diabetes mellitus without complications: Secondary | ICD-10-CM | POA: Diagnosis not present

## 2017-01-24 DIAGNOSIS — H5213 Myopia, bilateral: Secondary | ICD-10-CM | POA: Diagnosis not present

## 2017-01-24 DIAGNOSIS — H52203 Unspecified astigmatism, bilateral: Secondary | ICD-10-CM | POA: Diagnosis not present

## 2017-04-06 DIAGNOSIS — E1165 Type 2 diabetes mellitus with hyperglycemia: Secondary | ICD-10-CM | POA: Diagnosis not present

## 2017-04-06 DIAGNOSIS — I10 Essential (primary) hypertension: Secondary | ICD-10-CM | POA: Diagnosis not present

## 2017-04-06 DIAGNOSIS — Z7984 Long term (current) use of oral hypoglycemic drugs: Secondary | ICD-10-CM | POA: Diagnosis not present

## 2017-04-06 DIAGNOSIS — E782 Mixed hyperlipidemia: Secondary | ICD-10-CM | POA: Diagnosis not present

## 2017-05-18 ENCOUNTER — Other Ambulatory Visit: Payer: Self-pay | Admitting: Family Medicine

## 2017-05-18 ENCOUNTER — Ambulatory Visit
Admission: RE | Admit: 2017-05-18 | Discharge: 2017-05-18 | Disposition: A | Discharge status: Home / Self Care | Payer: 59 | Source: Ambulatory Visit | Attending: Family Medicine | Admitting: Family Medicine

## 2017-05-18 DIAGNOSIS — M5432 Sciatica, left side: Secondary | ICD-10-CM

## 2017-05-18 DIAGNOSIS — M5136 Other intervertebral disc degeneration, lumbar region: Secondary | ICD-10-CM | POA: Diagnosis not present

## 2017-05-18 DIAGNOSIS — R102 Pelvic and perineal pain: Secondary | ICD-10-CM | POA: Diagnosis not present

## 2017-05-18 DIAGNOSIS — M5430 Sciatica, unspecified side: Secondary | ICD-10-CM | POA: Diagnosis not present

## 2017-05-27 DIAGNOSIS — J069 Acute upper respiratory infection, unspecified: Secondary | ICD-10-CM | POA: Diagnosis not present

## 2017-05-27 DIAGNOSIS — R05 Cough: Secondary | ICD-10-CM | POA: Diagnosis not present

## 2017-06-04 DIAGNOSIS — R0789 Other chest pain: Secondary | ICD-10-CM | POA: Diagnosis not present

## 2017-06-16 ENCOUNTER — Other Ambulatory Visit: Payer: Self-pay | Admitting: Family Medicine

## 2017-06-16 DIAGNOSIS — M5416 Radiculopathy, lumbar region: Secondary | ICD-10-CM

## 2017-06-16 DIAGNOSIS — R3915 Urgency of urination: Secondary | ICD-10-CM | POA: Diagnosis not present

## 2017-06-22 ENCOUNTER — Ambulatory Visit
Admission: RE | Admit: 2017-06-22 | Discharge: 2017-06-22 | Disposition: A | Discharge status: Home / Self Care | Payer: 59 | Source: Ambulatory Visit | Attending: Family Medicine | Admitting: Family Medicine

## 2017-06-22 DIAGNOSIS — M48061 Spinal stenosis, lumbar region without neurogenic claudication: Secondary | ICD-10-CM | POA: Diagnosis not present

## 2017-06-22 DIAGNOSIS — M5416 Radiculopathy, lumbar region: Secondary | ICD-10-CM

## 2017-07-14 DIAGNOSIS — I10 Essential (primary) hypertension: Secondary | ICD-10-CM | POA: Diagnosis not present

## 2017-07-14 DIAGNOSIS — Z0000 Encounter for general adult medical examination without abnormal findings: Secondary | ICD-10-CM | POA: Diagnosis not present

## 2017-07-14 DIAGNOSIS — E782 Mixed hyperlipidemia: Secondary | ICD-10-CM | POA: Diagnosis not present

## 2017-07-15 DIAGNOSIS — M5416 Radiculopathy, lumbar region: Secondary | ICD-10-CM | POA: Diagnosis not present

## 2017-07-15 DIAGNOSIS — M5127 Other intervertebral disc displacement, lumbosacral region: Secondary | ICD-10-CM | POA: Diagnosis not present

## 2017-07-19 DIAGNOSIS — M5417 Radiculopathy, lumbosacral region: Secondary | ICD-10-CM | POA: Diagnosis not present

## 2017-07-19 DIAGNOSIS — M25552 Pain in left hip: Secondary | ICD-10-CM | POA: Diagnosis not present

## 2017-07-19 DIAGNOSIS — M545 Low back pain: Secondary | ICD-10-CM | POA: Diagnosis not present

## 2017-07-20 DIAGNOSIS — M545 Low back pain: Secondary | ICD-10-CM | POA: Diagnosis not present

## 2017-07-20 DIAGNOSIS — M5417 Radiculopathy, lumbosacral region: Secondary | ICD-10-CM | POA: Diagnosis not present

## 2017-07-20 DIAGNOSIS — M25552 Pain in left hip: Secondary | ICD-10-CM | POA: Diagnosis not present

## 2017-07-25 DIAGNOSIS — M25552 Pain in left hip: Secondary | ICD-10-CM | POA: Diagnosis not present

## 2017-07-25 DIAGNOSIS — M545 Low back pain: Secondary | ICD-10-CM | POA: Diagnosis not present

## 2017-07-25 DIAGNOSIS — M5417 Radiculopathy, lumbosacral region: Secondary | ICD-10-CM | POA: Diagnosis not present

## 2017-07-28 DIAGNOSIS — M5417 Radiculopathy, lumbosacral region: Secondary | ICD-10-CM | POA: Diagnosis not present

## 2017-07-28 DIAGNOSIS — M545 Low back pain: Secondary | ICD-10-CM | POA: Diagnosis not present

## 2017-07-28 DIAGNOSIS — M25552 Pain in left hip: Secondary | ICD-10-CM | POA: Diagnosis not present

## 2017-08-01 DIAGNOSIS — M545 Low back pain: Secondary | ICD-10-CM | POA: Diagnosis not present

## 2017-08-01 DIAGNOSIS — M5417 Radiculopathy, lumbosacral region: Secondary | ICD-10-CM | POA: Diagnosis not present

## 2017-08-01 DIAGNOSIS — M25552 Pain in left hip: Secondary | ICD-10-CM | POA: Diagnosis not present

## 2017-08-10 DIAGNOSIS — M5416 Radiculopathy, lumbar region: Secondary | ICD-10-CM | POA: Diagnosis not present

## 2017-08-10 DIAGNOSIS — M5127 Other intervertebral disc displacement, lumbosacral region: Secondary | ICD-10-CM | POA: Diagnosis not present

## 2017-08-19 DIAGNOSIS — J069 Acute upper respiratory infection, unspecified: Secondary | ICD-10-CM | POA: Diagnosis not present

## 2017-10-12 DIAGNOSIS — E1169 Type 2 diabetes mellitus with other specified complication: Secondary | ICD-10-CM | POA: Diagnosis not present

## 2017-10-12 DIAGNOSIS — E782 Mixed hyperlipidemia: Secondary | ICD-10-CM | POA: Diagnosis not present

## 2017-10-12 DIAGNOSIS — I10 Essential (primary) hypertension: Secondary | ICD-10-CM | POA: Diagnosis not present

## 2018-01-09 DIAGNOSIS — M546 Pain in thoracic spine: Secondary | ICD-10-CM | POA: Diagnosis not present

## 2018-01-09 DIAGNOSIS — M4316 Spondylolisthesis, lumbar region: Secondary | ICD-10-CM | POA: Insufficient documentation

## 2018-01-09 DIAGNOSIS — M47816 Spondylosis without myelopathy or radiculopathy, lumbar region: Secondary | ICD-10-CM | POA: Diagnosis not present

## 2018-01-09 DIAGNOSIS — M5136 Other intervertebral disc degeneration, lumbar region: Secondary | ICD-10-CM | POA: Diagnosis not present

## 2018-01-09 DIAGNOSIS — M5416 Radiculopathy, lumbar region: Secondary | ICD-10-CM | POA: Diagnosis not present

## 2018-01-09 DIAGNOSIS — M48062 Spinal stenosis, lumbar region with neurogenic claudication: Secondary | ICD-10-CM | POA: Diagnosis not present

## 2018-01-09 DIAGNOSIS — M48061 Spinal stenosis, lumbar region without neurogenic claudication: Secondary | ICD-10-CM | POA: Insufficient documentation

## 2018-01-13 DIAGNOSIS — M549 Dorsalgia, unspecified: Secondary | ICD-10-CM | POA: Diagnosis not present

## 2018-01-16 DIAGNOSIS — E782 Mixed hyperlipidemia: Secondary | ICD-10-CM | POA: Diagnosis not present

## 2018-01-16 DIAGNOSIS — E1169 Type 2 diabetes mellitus with other specified complication: Secondary | ICD-10-CM | POA: Diagnosis not present

## 2018-01-16 DIAGNOSIS — I10 Essential (primary) hypertension: Secondary | ICD-10-CM | POA: Diagnosis not present

## 2018-01-26 DIAGNOSIS — Z794 Long term (current) use of insulin: Secondary | ICD-10-CM | POA: Diagnosis not present

## 2018-01-26 DIAGNOSIS — H524 Presbyopia: Secondary | ICD-10-CM | POA: Diagnosis not present

## 2018-01-26 DIAGNOSIS — H52203 Unspecified astigmatism, bilateral: Secondary | ICD-10-CM | POA: Diagnosis not present

## 2018-01-26 DIAGNOSIS — E119 Type 2 diabetes mellitus without complications: Secondary | ICD-10-CM | POA: Diagnosis not present

## 2018-01-26 DIAGNOSIS — H2513 Age-related nuclear cataract, bilateral: Secondary | ICD-10-CM | POA: Diagnosis not present

## 2018-01-26 DIAGNOSIS — H5213 Myopia, bilateral: Secondary | ICD-10-CM | POA: Diagnosis not present

## 2018-02-22 DIAGNOSIS — Z6835 Body mass index (BMI) 35.0-35.9, adult: Secondary | ICD-10-CM | POA: Diagnosis not present

## 2018-02-22 DIAGNOSIS — Z803 Family history of malignant neoplasm of breast: Secondary | ICD-10-CM | POA: Diagnosis not present

## 2018-02-22 DIAGNOSIS — Z8041 Family history of malignant neoplasm of ovary: Secondary | ICD-10-CM | POA: Diagnosis not present

## 2018-02-22 DIAGNOSIS — Z01419 Encounter for gynecological examination (general) (routine) without abnormal findings: Secondary | ICD-10-CM | POA: Diagnosis not present

## 2018-03-07 DIAGNOSIS — L0390 Cellulitis, unspecified: Secondary | ICD-10-CM | POA: Diagnosis not present

## 2018-03-07 DIAGNOSIS — E1169 Type 2 diabetes mellitus with other specified complication: Secondary | ICD-10-CM | POA: Diagnosis not present

## 2018-03-10 ENCOUNTER — Other Ambulatory Visit: Payer: Self-pay

## 2018-03-10 ENCOUNTER — Encounter (HOSPITAL_COMMUNITY): Payer: Self-pay | Admitting: *Deleted

## 2018-03-10 DIAGNOSIS — L723 Sebaceous cyst: Secondary | ICD-10-CM | POA: Insufficient documentation

## 2018-03-10 DIAGNOSIS — Z7984 Long term (current) use of oral hypoglycemic drugs: Secondary | ICD-10-CM | POA: Diagnosis not present

## 2018-03-10 DIAGNOSIS — Z79899 Other long term (current) drug therapy: Secondary | ICD-10-CM | POA: Insufficient documentation

## 2018-03-10 DIAGNOSIS — I10 Essential (primary) hypertension: Secondary | ICD-10-CM | POA: Diagnosis not present

## 2018-03-10 DIAGNOSIS — M47816 Spondylosis without myelopathy or radiculopathy, lumbar region: Secondary | ICD-10-CM | POA: Diagnosis not present

## 2018-03-10 DIAGNOSIS — Z7982 Long term (current) use of aspirin: Secondary | ICD-10-CM | POA: Diagnosis not present

## 2018-03-10 DIAGNOSIS — E119 Type 2 diabetes mellitus without complications: Secondary | ICD-10-CM | POA: Insufficient documentation

## 2018-03-10 DIAGNOSIS — M5136 Other intervertebral disc degeneration, lumbar region: Secondary | ICD-10-CM | POA: Diagnosis not present

## 2018-03-10 DIAGNOSIS — M48062 Spinal stenosis, lumbar region with neurogenic claudication: Secondary | ICD-10-CM | POA: Diagnosis not present

## 2018-03-10 DIAGNOSIS — L539 Erythematous condition, unspecified: Secondary | ICD-10-CM | POA: Diagnosis present

## 2018-03-10 NOTE — ED Triage Notes (Signed)
Pt reports she has an area on her left buttocks area that she saw her doctor for a couple of days ago, and was started on abx for possible abscess or insect bite. She says the area is now worse. No fevers, n/v.

## 2018-03-11 ENCOUNTER — Emergency Department (HOSPITAL_COMMUNITY)
Admission: EM | Admit: 2018-03-11 | Discharge: 2018-03-11 | Disposition: A | Discharge status: Home / Self Care | Payer: 59 | Attending: Emergency Medicine | Admitting: Emergency Medicine

## 2018-03-11 DIAGNOSIS — L723 Sebaceous cyst: Secondary | ICD-10-CM

## 2018-03-11 MED ORDER — LIDOCAINE-EPINEPHRINE (PF) 2 %-1:200000 IJ SOLN
10.0000 mL | Freq: Once | INTRAMUSCULAR | Status: AC
  Administered 2018-03-11: 10 mL via INTRADERMAL
  Filled 2018-03-11: qty 20

## 2018-03-11 NOTE — Discharge Instructions (Addendum)
Have your sutures removed in 10 to 12 days.  Do not allow this wound to get wet and do not submerge in water for at least 2 weeks or until wound is completely closed, which ever comes first.  Follow-up with a dermatologist or general surgeon if the cyst recurs for appropriate decapitalization.

## 2018-03-11 NOTE — ED Notes (Signed)
Discharge instructions reviewed with pt. Pt verbalized understanding. Pt to follow up with PCP for packing removal. Wound dressed with sterile gauze and tape. Pt assisted out to car via wheelchair.

## 2018-03-11 NOTE — ED Provider Notes (Addendum)
[facility]-EMERGENCY DEPT Provider Note  CSN: [id number] Arrival date & time: 03/10/18 2126  Chief Complaint(s) Abscess  HPI Morgan Guerra is a 62 y.o. female with a history of diabetes who presents to the emergency department with area of redness on her left buttock that began 1 week ago.  She was seen by her primary care provider 4 days ago who believed this is an abscess versus an insect bite and put her on Bactrim.  Since then the redness and lump have increased in size and it become more painful.  Pain is throbbing in nature.  Exacerbated with palpation.  No alleviating factors.  She denies any fevers or chills.  No known trauma or insect bites.   HPI   Past Medical History Past Medical History:  Diagnosis Date  . Anxiety   . Depression   . Diabetes mellitus    NIDDM  . High cholesterol   . Hypertension    under control; has been on med. x "years"  . Trigger finger of both hands   . Trigger thumb of right hand 06/2011   There are no active problems to display for this patient.  Home Medication(s) Prior to Admission medications   Medication Sig Start Date End Date Taking? Authorizing Provider  aspirin EC 81 MG tablet Take 81 mg by mouth daily.   Yes [provider]  cholecalciferol (VITAMIN D) 1000 UNITS tablet Take 1,000 Units by mouth daily.    Yes [provider]  Empagliflozin-Linagliptin (GLYXAMBI) 25-5 MG TABS Take 1 tablet by mouth daily.    Yes [provider]  etodolac (LODINE) 400 MG tablet Take 400 mg by mouth 2 (two) times daily.   Yes [provider]  gabapentin (NEURONTIN) 300 MG capsule Take 300 mg by mouth 3 (three) times daily.   Yes [provider]  insulin degludec (TRESIBA FLEXTOUCH) 100 UNIT/ML SOPN FlexTouch Pen Inject 50 Units into the skin daily.   Yes [provider]  metFORMIN (GLUCOPHAGE) 1000 MG tablet Take 1,000 mg by mouth 2 (two) times daily with a meal.   Yes  [provider]  Multiple Vitamin (MULTIVITAMIN WITH MINERALS) TABS tablet Take 1 tablet by mouth daily.   Yes [provider]  simvastatin (ZOCOR) 20 MG tablet Take 20 mg by mouth daily.   Yes [provider]  sulfamethoxazole-trimethoprim (BACTRIM DS,SEPTRA DS) 800-160 MG tablet Take 1 tablet by mouth 2 (two) times daily. 03/07/18  Yes [provider]  venlafaxine (EFFEXOR-XR) 150 MG 24 hr capsule Take 150 mg by mouth daily. AM   Yes [provider]  HYDROcodone-acetaminophen (NORCO) 5-325 MG tablet Take 1 tablet by mouth every 6 (six) hours as needed for moderate pain. Patient not taking: Reported on 03/11/2018 04/13/16   Cindee Salt, MD  Past Surgical History Past Surgical History:  Procedure Laterality Date  . CARPAL TUNNEL RELEASE     right  . TRIGGER FINGER RELEASE  05/18/2011   left middle and small fingers  . TRIGGER FINGER RELEASE  01/24/2009   left ring  . TRIGGER FINGER RELEASE  07/03/2009   right ring and little fingers  . TRIGGER FINGER RELEASE  07/02/2011   Procedure: RELEASE TRIGGER FINGER/A-1 PULLEY;  Surgeon: Nicki Reaper, MD;  Location: [facility];  Service: Orthopedics;  Laterality: Right;  right thumb  . TRIGGER FINGER RELEASE  05/19/2012   Procedure: RELEASE TRIGGER FINGER/A-1 PULLEY;  Surgeon: Nicki Reaper, MD;  Location: [facility];  Service: Orthopedics;  Laterality: Right;  right middle finger   . TRIGGER FINGER RELEASE Right 04/13/2016   Procedure: RELEASE TRIGGER FINGER/A-1 PULLEY, right index finger;  Surgeon: Cindee Salt, MD;  Location: [facility];  Service: Orthopedics;  Laterality: Right;  FAB  . TUBAL LIGATION  1980s   Family History Family History  Problem Relation Age of Onset  . Anesthesia problems Sister        hard to wake up post-op; has  polio    Social History Social History   Tobacco Use  . Smoking status: Never Smoker  . Smokeless tobacco: Never Used  Substance Use Topics  . Alcohol use: No  . Drug use: No   Allergies Patient has no known allergies.  Review of Systems Review of Systems All other systems are reviewed and are negative for acute change except as noted in the HPI  Physical Exam Vital Signs  I have reviewed the triage vital signs BP 127/81 (BP Location: Right Arm)   Pulse 85   Temp 98.3 F (36.8 C) (Oral)   Resp 18   SpO2 98%   Physical Exam  Constitutional: She is oriented to person, place, and time. She appears well-developed and well-nourished. No distress.  HENT:  Head: Normocephalic and atraumatic.  Right Ear: External ear normal.  Left Ear: External ear normal.  Nose: Nose normal.  Eyes: Conjunctivae and EOM are normal. No scleral icterus.  Neck: Normal range of motion and phonation normal.  Cardiovascular: Normal rate and regular rhythm.  Pulmonary/Chest: Effort normal. No stridor. No respiratory distress.  Abdominal: She exhibits no distension.  Musculoskeletal: Normal range of motion. She exhibits no edema.  Neurological: She is alert and oriented to person, place, and time.  Skin: She is not diaphoretic.     Psychiatric: She has a normal mood and affect. Her behavior is normal.  Vitals reviewed.   ED Results and Treatments Labs (all labs ordered are listed, but only abnormal results are displayed) Labs Reviewed - No data to display                                                                                                                       EKG  EKG Interpretation  Date/Time:    Ventricular Rate:  PR Interval:    QRS Duration:   QT Interval:    QTC Calculation:   R Axis:     Text Interpretation:        Radiology No results found. Pertinent labs & imaging results that were available during my care of the patient were reviewed by me and considered  in my medical decision making (see chart for details).  Medications Ordered in ED Medications  lidocaine-EPINEPHrine (XYLOCAINE W/EPI) 2 %-1:200000 (PF) injection 10 mL (has no administration in time range)                                                                                                                                    Procedures .Marland KitchenIncision and Drainage Date/Time: 03/11/2018 3:20 AM Performed by: Nira Conn, MD Authorized by: Nira Conn, MD   Consent:    Consent obtained:  Verbal   Consent given by:  Patient   Risks discussed:  Bleeding, incomplete drainage and infection   Alternatives discussed:  Delayed treatment Location:    Indications for incision and drainage: abscess vs sebaceuous cyst.   Size:  2.5 cm   Location: left buttocks. Pre-procedure details:    Skin preparation:  Chloraprep Anesthesia (see MAR for exact dosages):    Anesthesia method:  Local infiltration   Local anesthetic:  Lidocaine 2% WITH epi Procedure type:    Complexity:  Complex Procedure details:    Needle aspiration: no     Incision types:  Cruciate   Incision depth:  Subcutaneous   Scalpel blade:  11   Wound management:  Probed and deloculated and debrided (partial decapsulation)   Drainage characteristics: bloody and sebaceous.   Wound treatment:  Wound left open   Packing materials:  1/2 in iodoform gauze Post-procedure details:    Patient tolerance of procedure:  Tolerated well, no immediate complications Comments:     Partial closure of the cruciate wound with two 3-0 sutures.  Linear wound was left open and packed.   EMERGENCY DEPARTMENT US SOFT TISSUE INTERPRETATION "Study: Limited Soft Tissue Ultrasound"  INDICATIONS: Pain and Soft tissue infection Multiple views of the body part were obtained in real-time with a multi-frequency linear probe  PERFORMED BY: Myself IMAGES ARCHIVED?: Yes SIDE:Left BODY PART:buttock INTERPRETATION:  abscess vs  sebaceous cyst    (including critical care time)  Medical Decision Making / ED Course I have reviewed the nursing notes for this encounter and the patient's prior records (if available in EHR or on provided paperwork).    Erythema with underlying induration and fluctuance.  Abscess versus inflamed sebaceous cyst.  Bedside ultrasound revealed subcutaneous fluid.  I&D as above.  Most consistent with an inflamed sebaceous cyst.  Close PCP follow-up for wound check and packing removal in 2 to 3 days recommended.  Suture removal and 10 to 12 days recommended.  Follow-up with dermatology or general surgery if cyst recurs.  The patient appears  reasonably screened and/or stabilized for discharge and I doubt any other medical condition or other [facility] requiring further screening, evaluation, or treatment in the ED at this time prior to discharge.  The patient is safe for discharge with strict return precautions.   Final Clinical Impression(s) / ED Diagnoses Final diagnoses:  Inflamed sebaceous cyst   Disposition: Discharge  Condition: Good  I have discussed the results, Dx and Tx plan with the patient who expressed understanding and agree(s) with the plan. Discharge instructions discussed at great length. The patient was given strict return precautions who verbalized understanding of the instructions. No further questions at time of discharge.    ED Discharge Orders    None       Follow Up: Lupita Raider, MD [address] [place] [address] [phone]   in 3-5 days, For wound re-check and packing removal      This chart was dictated using voice recognition software.  Despite best efforts to proofread,  errors can occur which can change the documentation meaning.     Nira Conn, MD 03/11/18 [phone]

## 2018-03-13 DIAGNOSIS — L0291 Cutaneous abscess, unspecified: Secondary | ICD-10-CM | POA: Diagnosis not present

## 2018-03-13 DIAGNOSIS — M5430 Sciatica, unspecified side: Secondary | ICD-10-CM | POA: Diagnosis not present

## 2018-03-16 DIAGNOSIS — M4726 Other spondylosis with radiculopathy, lumbar region: Secondary | ICD-10-CM | POA: Diagnosis not present

## 2018-03-16 DIAGNOSIS — M48061 Spinal stenosis, lumbar region without neurogenic claudication: Secondary | ICD-10-CM | POA: Diagnosis not present

## 2018-03-16 DIAGNOSIS — M5136 Other intervertebral disc degeneration, lumbar region: Secondary | ICD-10-CM | POA: Diagnosis not present

## 2018-03-28 DIAGNOSIS — M5136 Other intervertebral disc degeneration, lumbar region: Secondary | ICD-10-CM | POA: Diagnosis not present

## 2018-03-28 DIAGNOSIS — M47816 Spondylosis without myelopathy or radiculopathy, lumbar region: Secondary | ICD-10-CM | POA: Diagnosis not present

## 2018-03-28 DIAGNOSIS — M48062 Spinal stenosis, lumbar region with neurogenic claudication: Secondary | ICD-10-CM | POA: Diagnosis not present

## 2018-03-30 DIAGNOSIS — M48062 Spinal stenosis, lumbar region with neurogenic claudication: Secondary | ICD-10-CM | POA: Diagnosis not present

## 2018-04-13 DIAGNOSIS — M48062 Spinal stenosis, lumbar region with neurogenic claudication: Secondary | ICD-10-CM | POA: Diagnosis not present

## 2018-04-17 DIAGNOSIS — M47816 Spondylosis without myelopathy or radiculopathy, lumbar region: Secondary | ICD-10-CM | POA: Diagnosis not present

## 2018-04-17 DIAGNOSIS — S32050A Wedge compression fracture of fifth lumbar vertebra, initial encounter for closed fracture: Secondary | ICD-10-CM | POA: Diagnosis not present

## 2018-04-17 DIAGNOSIS — S32040A Wedge compression fracture of fourth lumbar vertebra, initial encounter for closed fracture: Secondary | ICD-10-CM | POA: Diagnosis not present

## 2018-04-17 DIAGNOSIS — S32030A Wedge compression fracture of third lumbar vertebra, initial encounter for closed fracture: Secondary | ICD-10-CM | POA: Diagnosis not present

## 2018-04-20 ENCOUNTER — Other Ambulatory Visit: Payer: Self-pay | Admitting: Neurosurgery

## 2018-04-20 DIAGNOSIS — S32030A Wedge compression fracture of third lumbar vertebra, initial encounter for closed fracture: Secondary | ICD-10-CM

## 2018-04-24 DIAGNOSIS — Z809 Family history of malignant neoplasm, unspecified: Secondary | ICD-10-CM | POA: Diagnosis not present

## 2018-04-25 DIAGNOSIS — M545 Low back pain: Secondary | ICD-10-CM | POA: Diagnosis not present

## 2018-04-26 DIAGNOSIS — Z13820 Encounter for screening for osteoporosis: Secondary | ICD-10-CM | POA: Diagnosis not present

## 2018-05-23 DIAGNOSIS — M818 Other osteoporosis without current pathological fracture: Secondary | ICD-10-CM | POA: Diagnosis not present

## 2018-07-18 DIAGNOSIS — S32050A Wedge compression fracture of fifth lumbar vertebra, initial encounter for closed fracture: Secondary | ICD-10-CM | POA: Diagnosis not present

## 2018-07-18 DIAGNOSIS — M47816 Spondylosis without myelopathy or radiculopathy, lumbar region: Secondary | ICD-10-CM | POA: Diagnosis not present

## 2018-07-18 DIAGNOSIS — S32040A Wedge compression fracture of fourth lumbar vertebra, initial encounter for closed fracture: Secondary | ICD-10-CM | POA: Diagnosis not present

## 2018-08-04 DIAGNOSIS — I10 Essential (primary) hypertension: Secondary | ICD-10-CM | POA: Diagnosis not present

## 2018-08-04 DIAGNOSIS — Z0000 Encounter for general adult medical examination without abnormal findings: Secondary | ICD-10-CM | POA: Diagnosis not present

## 2018-08-04 DIAGNOSIS — E782 Mixed hyperlipidemia: Secondary | ICD-10-CM | POA: Diagnosis not present

## 2018-08-04 DIAGNOSIS — E1165 Type 2 diabetes mellitus with hyperglycemia: Secondary | ICD-10-CM | POA: Diagnosis not present

## 2018-08-28 DIAGNOSIS — R300 Dysuria: Secondary | ICD-10-CM | POA: Diagnosis not present

## 2018-09-26 DIAGNOSIS — S32040A Wedge compression fracture of fourth lumbar vertebra, initial encounter for closed fracture: Secondary | ICD-10-CM | POA: Diagnosis not present

## 2018-09-26 DIAGNOSIS — M47816 Spondylosis without myelopathy or radiculopathy, lumbar region: Secondary | ICD-10-CM | POA: Diagnosis not present

## 2018-09-26 DIAGNOSIS — S32050A Wedge compression fracture of fifth lumbar vertebra, initial encounter for closed fracture: Secondary | ICD-10-CM | POA: Diagnosis not present

## 2019-09-25 DIAGNOSIS — M5416 Radiculopathy, lumbar region: Secondary | ICD-10-CM | POA: Insufficient documentation

## 2019-09-25 DIAGNOSIS — M5136 Other intervertebral disc degeneration, lumbar region: Secondary | ICD-10-CM | POA: Insufficient documentation

## 2020-07-16 ENCOUNTER — Ambulatory Visit: Payer: 59 | Attending: Internal Medicine

## 2020-07-16 ENCOUNTER — Other Ambulatory Visit (HOSPITAL_COMMUNITY): Payer: Self-pay | Admitting: Internal Medicine

## 2020-07-16 DIAGNOSIS — Z23 Encounter for immunization: Secondary | ICD-10-CM

## 2020-07-16 NOTE — Progress Notes (Signed)
   Covid-19 Vaccination Clinic  Name:  ROSANA FARNELL    MRN: [id number] DOB: 11/26/1955  07/16/2020  Ms. Canal was observed post Covid-19 immunization for 15 minutes without incident. She was provided with Vaccine Information Sheet and instruction to access the V-Safe system.   Ms. Kloehn was instructed to call 911 with any severe reactions post vaccine: Marland Kitchen Difficulty breathing  . Swelling of face and throat  . A fast heartbeat  . A bad rash all over body  . Dizziness and weakness   Immunizations Administered    Name Date Dose VIS Date Route   Moderna Covid-19 Booster Vaccine 07/16/2020 10:03 AM 0.25 mL 04/23/2020 Intramuscular   Manufacturer: Gala Murdoch   Lot: [id number]   NDC: 82500-370-48

## 2020-09-18 ENCOUNTER — Ambulatory Visit (INDEPENDENT_AMBULATORY_CARE_PROVIDER_SITE_OTHER): Payer: 59 | Admitting: Family Medicine

## 2020-09-18 ENCOUNTER — Other Ambulatory Visit: Payer: Self-pay

## 2020-09-18 ENCOUNTER — Encounter (INDEPENDENT_AMBULATORY_CARE_PROVIDER_SITE_OTHER): Payer: Self-pay | Admitting: Family Medicine

## 2020-09-18 VITALS — BP 85/59 | HR 92 | Temp 97.9°F | Ht 64.0 in | Wt 214.0 lb

## 2020-09-18 DIAGNOSIS — M818 Other osteoporosis without current pathological fracture: Secondary | ICD-10-CM

## 2020-09-18 DIAGNOSIS — F32A Depression, unspecified: Secondary | ICD-10-CM

## 2020-09-18 DIAGNOSIS — R0602 Shortness of breath: Secondary | ICD-10-CM

## 2020-09-18 DIAGNOSIS — E785 Hyperlipidemia, unspecified: Secondary | ICD-10-CM

## 2020-09-18 DIAGNOSIS — Z6836 Body mass index (BMI) 36.0-36.9, adult: Secondary | ICD-10-CM

## 2020-09-18 DIAGNOSIS — K760 Fatty (change of) liver, not elsewhere classified: Secondary | ICD-10-CM

## 2020-09-18 DIAGNOSIS — Z0289 Encounter for other administrative examinations: Secondary | ICD-10-CM

## 2020-09-18 DIAGNOSIS — E1159 Type 2 diabetes mellitus with other circulatory complications: Secondary | ICD-10-CM | POA: Diagnosis not present

## 2020-09-18 DIAGNOSIS — E1169 Type 2 diabetes mellitus with other specified complication: Secondary | ICD-10-CM

## 2020-09-18 DIAGNOSIS — R5383 Other fatigue: Secondary | ICD-10-CM | POA: Diagnosis not present

## 2020-09-18 DIAGNOSIS — Z9189 Other specified personal risk factors, not elsewhere classified: Secondary | ICD-10-CM | POA: Diagnosis not present

## 2020-09-18 DIAGNOSIS — F419 Anxiety disorder, unspecified: Secondary | ICD-10-CM

## 2020-09-18 DIAGNOSIS — M545 Low back pain, unspecified: Secondary | ICD-10-CM

## 2020-09-18 DIAGNOSIS — G8929 Other chronic pain: Secondary | ICD-10-CM

## 2020-09-18 DIAGNOSIS — Z794 Long term (current) use of insulin: Secondary | ICD-10-CM

## 2020-09-18 DIAGNOSIS — E65 Localized adiposity: Secondary | ICD-10-CM

## 2020-09-18 DIAGNOSIS — I152 Hypertension secondary to endocrine disorders: Secondary | ICD-10-CM

## 2020-09-25 NOTE — Progress Notes (Signed)
Dear Dr. Clelia CroftShaw,   Thank you for referring Morgan GarnerSusan M Guerra to our clinic. The following note includes my evaluation and treatment recommendations.  Chief Complaint:   OBESITY Morgan GarnerSusan M Guerra (MR# [id number]) is a 65 y.o. female who presents for evaluation and treatment of obesity and related comorbidities. Current BMI is Body mass index is 36.73 kg/m. Morgan PikesSusan has been struggling with her weight for many years and has been unsuccessful in either losing weight, maintaining weight loss, or reaching her healthy weight goal.  Morgan PikesSusan is currently in the action stage of change and ready to dedicate time achieving and maintaining a healthier weight. Morgan PikesSusan is interested in becoming our patient and working on intensive lifestyle modifications including (but not limited to) diet and exercise for weight loss.  Morgan PikesSusan likes to walk at work.  Blood glucose 110, denies lows.  Insulin 85-105.  ?CGM.  Morgan Guerra's habits were reviewed today and are as follows: Her family eats meals together, she thinks her family will eat healthier with her, her desired weight loss is 43 pounds, she has been heavy most of her life, she started gaining weight in her mid to late 20s, her heaviest weight ever was 236 pounds, she craves salty snacks and sweets (candy, cookies), she snacks frequently in the evenings, she is frequently drinking liquids with calories, she sometimes makes poor food choices, she has problems with excessive hunger, she sometimes eats larger portions than normal and she struggles with emotional eating.  Depression Screen Morgan Guerra's Food and Mood (modified PHQ-9) score was 7.  Depression screen PHQ 2/9 09/18/2020  Decreased Interest 1  Down, Depressed, Hopeless 1  PHQ - 2 Score 2  Altered sleeping 1  Tired, decreased energy 1  Change in appetite 1  Feeling bad or failure about yourself  1  Trouble concentrating 1  Moving slowly or fidgety/restless 0  Suicidal thoughts 0  PHQ-9 Score 7  Difficult doing  work/chores Somewhat difficult   Assessment/Plan:   1. Other fatigue Morgan PikesSusan admits to daytime somnolence and denies waking up still tired. Patent has a history of symptoms of daytime fatigue. Morgan PikesSusan generally gets 7 hours of sleep per night, and states that she has generally restful sleep. Snoring is not present. Apneic episodes are not present. Epworth Sleepiness Score is 9.  Morgan PikesSusan does not feel that her weight is causing her energy to be lower than it should be. Fatigue may be related to obesity, depression or many other causes. Labs will be ordered, and in the meanwhile, Morgan PikesSusan will focus on self care including making healthy food choices, increasing physical activity and focusing on stress reduction.  - EKG 12-Lead  2. SOB (shortness of breath) on exertion Morgan PikesSusan notes increasing shortness of breath with exercising and seems to be worsening over time with weight gain. She notes getting out of breath sooner with activity than she used to. This has not gotten worse recently. Morgan PikesSusan denies shortness of breath at rest or orthopnea.  Morgan PikesSusan does not feel that she gets out of breath more easily that she used to when she exercises. Morgan Guerra's shortness of breath appears to be obesity related and exercise induced. She has agreed to work on weight loss and gradually increase exercise to treat her exercise induced shortness of breath. Will continue to monitor closely.  3. Type 2 diabetes mellitus with other specified complication, with long-term current use of insulin (HCC) Diabetes Mellitus: Not at goal. Medication: metformin 1,000 mg twice daily, Tresiba 90 units daily, empagliflozin-linagliptin  25-5 daily. Issues reviewed: blood sugar goals, complications of diabetes mellitus, hypoglycemia prevention and treatment, exercise, and nutrition.   Plan:  Would benefit from CGM.  The importance of regular follow up with PCP and all other specialists as scheduled was stressed to patient today.  Lab Results   Component Value Date   HGBA1C (H) 11/24/2009    7.4 (NOTE)                                                                       According to the ADA Clinical Practice Recommendations for 2011, when HbA1c is used as a screening test:   >=6.5%   Diagnostic of Diabetes Mellitus           (if abnormal result  is confirmed)  5.7-6.4%   Increased risk of developing Diabetes Mellitus  References:Diagnosis and Classification of Diabetes Mellitus,Diabetes Care,2011,34(Suppl 1):S62-S69 and Standards of Medical Care in         Diabetes - 2011,Diabetes Care,2011,34  (Suppl 1):S11-S61.   Lab Results  Component Value Date   [facility]  11/23/2009    39        Total Cholesterol/HDL:CHD Risk Coronary Heart Disease Risk Table                     Men   Women  1/2 Average Risk   3.4   3.3  Average Risk       5.0   4.4  2 X Average Risk   9.6   7.1  3 X Average Risk  23.4   11.0        Use the calculated Patient Ratio above and the CHD Risk Table to determine the patient's CHD Risk.        ATP III CLASSIFICATION (LDL):  <100     mg/dL   Optimal  161-096  mg/dL   Near or Above                    Optimal  130-159  mg/dL   Borderline  045-409  mg/dL   High  >811     mg/dL   Very High   CREATININE 0.63 04/12/2016   4. Hyperlipidemia associated with type 2 diabetes mellitus Course: Not at goal. Lipid-lowering medications: Zocor 20 mg daily.   Plan: Dietary changes: Increase soluble fiber, decrease simple carbohydrates, decrease saturated fat. Exercise changes: Moderate to vigorous-intensity aerobic activity 150 minutes per week or as tolerated. We will continue to monitor along with PCP/specialists as it pertains to her weight loss journey.  Lab Results  Component Value Date   CHOL  11/23/2009    101        ATP III CLASSIFICATION:  <200     mg/dL   Desirable  914-782  mg/dL   Borderline High  >=956    mg/dL   High          HDL 34 (L) 11/23/2009   LDLCALC  11/23/2009    39        Total  Cholesterol/HDL:CHD Risk Coronary Heart Disease Risk Table                     Men   Women  1/2 Average Risk   3.4   3.3  Average Risk       5.0   4.4  2 X Average Risk   9.6   7.1  3 X Average Risk  23.4   11.0        Use the calculated Patient Ratio above and the CHD Risk Table to determine the patient's CHD Risk.        ATP III CLASSIFICATION (LDL):  <100     mg/dL   Optimal  628-315  mg/dL   Near or Above                    Optimal  130-159  mg/dL   Borderline  176-160  mg/dL   High  >737     mg/dL   Very High   TRIG 106 11/23/2009   CHOLHDL 3.0 11/23/2009   Lab Results  Component Value Date   ALT 34 11/22/2009   AST 37 11/22/2009   ALKPHOS 80 11/22/2009   BILITOT 0.7 11/22/2009   5. Hypertension associated with diabetes Low today.  Medications: Zestoretic 20-12.5 mg daily.   Plan: Avoid buying foods that are: processed, frozen, or prepackaged to avoid excess salt. We will continue to monitor closely alongside her PCP and/or Specialist.  Regular follow up with PCP and specialists was also encouraged.   BP Readings from Last 3 Encounters:  09/18/20 (!) 85/59  03/11/18 103/79  04/13/16 119/69   Lab Results  Component Value Date   CREATININE 0.63 04/12/2016   6. Other osteoporosis, unspecified pathological fracture presence Morgan Guerra gets Reclast every year for osteoporosis.  She is also taking vitamin D and calcium.   7. Hepatic steatosis Bland steatosis is felt to be a benign condition, with extremely low to no risk of progression to cirrhosis, whereas NASH can progress to cirrhosis. The mainstay of treatment includes lifestyle modification to achieve weight loss, at least 7% of current body weight. Low carbohydrate diets can be beneficial in improving NAFLD liver histology. Additionally, exercise, even the absence of weight loss can have beneficial effects on the patient's metabolic profile and liver health.   8. Visceral obesity Current visceral fat rating: 15.  Visceral fat rating should be < 13. Visceral adipose tissue is a hormonally active component of total body fat. This body composition phenotype is associated with medical disorders such as metabolic syndrome, cardiovascular disease and several malignancies including prostate, breast, and colorectal cancers. Starting goal: Lose 7-10% of starting weight.   9. Chronic low back pain, unspecified back pain laterality, unspecified whether sciatica present Stable.  Morgan Guerra gets an ESI every 6 months.  10. Anxiety and depression, with emotional eating Not at goal. Medication: Effexor-XR 225 mg daily.  Plan:  Behavior modification techniques were discussed today to help deal with emotional/non-hunger eating behaviors.  11. At risk for heart disease Due to Morgan Guerra's current state of health and medical condition(s), she is at a higher risk for heart disease.  This puts the patient at much greater risk to subsequently develop cardiopulmonary conditions that can significantly affect patient's quality of life in a negative manner.    At least 8 minutes were spent on counseling Morgan Guerra about these concerns today. Evidence-based interventions for health behavior change were utilized today including the discussion of self monitoring techniques, problem-solving barriers, and SMART goal setting techniques.  Specifically, regarding patient's less desirable eating habits and patterns, we employed the technique of small changes when Hanaan has not been able to  fully commit to her prudent nutritional plan.  12. Class 2 severe obesity with serious comorbidity and body mass index (BMI) of 36.0 to 36.9 in adult, unspecified obesity type Morgan Guerra)  Morgan Guerra is currently in the action stage of change and her goal is to continue with weight loss efforts. I recommend Morgan Guerra begin the structured treatment plan as follows:  She has agreed to the Category 2 Plan.  Exercise goals: No exercise has been prescribed at this time.   Behavioral  modification strategies: increasing lean protein intake, decreasing simple carbohydrates, increasing vegetables, increasing water intake, decreasing liquid calories, decreasing sodium intake and increasing high fiber foods.  She was informed of the importance of frequent follow-up visits to maximize her success with intensive lifestyle modifications for her multiple health conditions. She was informed we would discuss her lab results at her next visit unless there is a critical issue that needs to be addressed sooner. Morgan Guerra agreed to keep her next visit at the agreed upon time to discuss these results.  Objective:   Blood pressure (!) 85/59, pulse 92, temperature 97.9 F (36.6 C), height 5\' 4"  (1.626 m), weight 214 lb (97.1 kg), SpO2 96 %. Body mass index is 36.73 kg/m.  EKG: Normal sinus rhythm, rate 92 bpm.  Indirect Calorimeter completed today shows a VO2 of 296 and a REE of 2062.  Her calculated basal metabolic rate is 2063 thus her basal metabolic rate is better than expected.  General: Cooperative, alert, well developed, in no acute distress. HEENT: Conjunctivae and lids unremarkable. Cardiovascular: Regular rhythm.  Lungs: Normal work of breathing. Neurologic: No focal deficits.   Lab Results  Component Value Date   CREATININE 0.63 04/12/2016   BUN 14 04/12/2016   NA 140 04/12/2016   K 4.1 04/12/2016   CL 105 04/12/2016   CO2 26 04/12/2016   Lab Results  Component Value Date   ALT 34 11/22/2009   AST 37 11/22/2009   ALKPHOS 80 11/22/2009   BILITOT 0.7 11/22/2009   Lab Results  Component Value Date   HGBA1C (H) 11/24/2009    7.4 (NOTE)                                                                       According to the ADA Clinical Practice Recommendations for 2011, when HbA1c is used as a screening test:   >=6.5%   Diagnostic of Diabetes Mellitus           (if abnormal result  is confirmed)  5.7-6.4%   Increased risk of developing Diabetes Mellitus   References:Diagnosis and Classification of Diabetes Mellitus,Diabetes Care,2011,34(Suppl 1):S62-S69 and Standards of Medical Care in         Diabetes - 2011,Diabetes Care,2011,34  (Suppl 1):S11-S61.   No results found for: INSULIN Lab Results  Component Value Date   TSH 1.853  11/23/2009   Lab Results  Component Value Date   CHOL  11/23/2009    101        ATP III CLASSIFICATION:  <200     mg/dL   Desirable  11/25/2009  mg/dL   Borderline High  758-832    mg/dL   High          HDL 34 (L) 11/23/2009  LDLCALC  11/23/2009    39        Total Cholesterol/HDL:CHD Risk Coronary Heart Disease Risk Table                     Men   Women  1/2 Average Risk   3.4   3.3  Average Risk       5.0   4.4  2 X Average Risk   9.6   7.1  3 X Average Risk  23.4   11.0        Use the calculated Patient Ratio above and the CHD Risk Table to determine the patient's CHD Risk.        ATP III CLASSIFICATION (LDL):  <100     mg/dL   Optimal  165-537  mg/dL   Near or Above                    Optimal  130-159  mg/dL   Borderline  482-707  mg/dL   High  >867     mg/dL   Very High   TRIG 544 11/23/2009   CHOLHDL 3.0 11/23/2009   Lab Results  Component Value Date   WBC 6.9 11/23/2009   HGB 15.5 (H) 05/19/2012   HCT 38.0 05/18/2011   MCV 92.6 11/23/2009   PLT 159 DELTA CHECK NOTED 11/23/2009   Attestation Statements:   This is the patient's first visit at [facility]. The patient's NEW PATIENT PACKET was reviewed at length. Included in the packet: current and past health history, medications, allergies, ROS, gynecologic history (women only), surgical history, family history, social history, weight history, weight loss surgery history (for those that have had weight loss surgery), nutritional evaluation, mood and food questionnaire, PHQ9, Epworth questionnaire, sleep habits questionnaire, patient life and health improvement goals questionnaire. These will all be scanned into the patient's  chart under media.   During the visit, I independently reviewed the patient's EKG, bioimpedance scale results, and indirect calorimeter results. I used this information to tailor a meal plan for the patient that will help her to lose weight and will improve her obesity-related conditions going forward. I performed a medically necessary appropriate examination and/or evaluation. I discussed the assessment and treatment plan with the patient. The patient was provided an opportunity to ask questions and all were answered. The patient agreed with the plan and demonstrated an understanding of the instructions. Labs were ordered at this visit and will be reviewed at the next visit unless more critical results need to be addressed immediately. Clinical information was updated and documented in the EMR.   I, Insurance claims handler, CMA, am acting as transcriptionist for Helane Rima, DO  I have reviewed the above documentation for accuracy and completeness, and I agree with the above. Helane Rima, DO

## 2020-09-30 ENCOUNTER — Emergency Department (HOSPITAL_COMMUNITY): Payer: 59

## 2020-09-30 ENCOUNTER — Encounter (HOSPITAL_COMMUNITY): Payer: Self-pay

## 2020-09-30 ENCOUNTER — Other Ambulatory Visit: Payer: Self-pay

## 2020-09-30 ENCOUNTER — Emergency Department (HOSPITAL_COMMUNITY)
Admission: EM | Admit: 2020-09-30 | Discharge: 2020-09-30 | Disposition: A | Discharge status: Home / Self Care | Payer: 59 | Attending: Emergency Medicine | Admitting: Emergency Medicine

## 2020-09-30 DIAGNOSIS — Z7984 Long term (current) use of oral hypoglycemic drugs: Secondary | ICD-10-CM | POA: Insufficient documentation

## 2020-09-30 DIAGNOSIS — G8929 Other chronic pain: Secondary | ICD-10-CM | POA: Diagnosis not present

## 2020-09-30 DIAGNOSIS — R072 Precordial pain: Secondary | ICD-10-CM | POA: Diagnosis not present

## 2020-09-30 DIAGNOSIS — Z7982 Long term (current) use of aspirin: Secondary | ICD-10-CM | POA: Diagnosis not present

## 2020-09-30 DIAGNOSIS — E119 Type 2 diabetes mellitus without complications: Secondary | ICD-10-CM | POA: Diagnosis not present

## 2020-09-30 DIAGNOSIS — M545 Low back pain, unspecified: Secondary | ICD-10-CM | POA: Diagnosis not present

## 2020-09-30 DIAGNOSIS — Z794 Long term (current) use of insulin: Secondary | ICD-10-CM | POA: Diagnosis not present

## 2020-09-30 DIAGNOSIS — R0782 Intercostal pain: Secondary | ICD-10-CM | POA: Insufficient documentation

## 2020-09-30 DIAGNOSIS — I10 Essential (primary) hypertension: Secondary | ICD-10-CM | POA: Insufficient documentation

## 2020-09-30 DIAGNOSIS — R0789 Other chest pain: Secondary | ICD-10-CM

## 2020-09-30 DIAGNOSIS — Z79899 Other long term (current) drug therapy: Secondary | ICD-10-CM | POA: Insufficient documentation

## 2020-09-30 MED ORDER — DEXAMETHASONE SODIUM PHOSPHATE 10 MG/ML IJ SOLN
10.0000 mg | Freq: Once | INTRAMUSCULAR | Status: AC
  Administered 2020-09-30: 10 mg via INTRAMUSCULAR
  Filled 2020-09-30: qty 1

## 2020-09-30 MED ORDER — CYCLOBENZAPRINE HCL 10 MG PO TABS: 10.0000 mg | ORAL_TABLET | Freq: Two times a day (BID) | ORAL | 0 refills | Status: DC | PRN

## 2020-09-30 NOTE — ED Provider Notes (Signed)
[facility] EMERGENCY DEPARTMENT Provider Note   CSN: [id number] Arrival date & time: 09/30/20  1042     History No chief complaint on file.   Morgan Guerra is a 65 y.o. female.  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  That being said she has been able to ambulate and has tolerated injections in her back over time which have done very well at controlling her pain.  It has been many months since she last had an injection, unfortunately she had an accidental fall onto her lower back within the last couple of weeks and because of the pain in her back she went to see the neurosurgical spine clinic today, was seen by the provider, she was not given any pain medications because she developed some acute chest pain in the office when she felt a pop in her sternum when she was trying to walk across the room.  She reports that pain is worse with palpating on the chest, she has never had anything like that before, her back pain is constant, she has been taking etodolac and gabapentin and takes it only as needed.  She does not have an MRI ordered, she does have an injection scheduled for 12 April.  Past Medical History:  Diagnosis Date  . Anxiety   . Compressed cervical disc    lower back  . Depression   . Diabetes mellitus    NIDDM  . Fatty liver   . High cholesterol   . Hyperlipidemia   . Hypertension    under control; has been on med. x "years"  . SOBOE (shortness of breath on exertion)   . Trigger finger of both hands   . Trigger thumb of right hand 06/2011    There are no problems to display for this patient.   Past Surgical History:  Procedure Laterality Date  . CARPAL TUNNEL RELEASE     right  . TRIGGER FINGER RELEASE  05/18/2011   left middle and small fingers  . TRIGGER FINGER RELEASE  01/24/2009   left ring   . TRIGGER FINGER RELEASE  07/03/2009   right ring and little fingers  . TRIGGER FINGER RELEASE  07/02/2011   Procedure: RELEASE TRIGGER FINGER/A-1 PULLEY;  Surgeon: Wynonia Sours, MD;  Location: [facility];  Service: Orthopedics;  Laterality: Right;  right thumb  . TRIGGER FINGER RELEASE  05/19/2012   Procedure: RELEASE TRIGGER FINGER/A-1 PULLEY;  Surgeon: Wynonia Sours, MD;  Location: [facility];  Service: Orthopedics;  Laterality: Right;  right middle finger   . TRIGGER FINGER RELEASE Right 04/13/2016   Procedure: RELEASE TRIGGER FINGER/A-1 PULLEY, right index finger;  Surgeon: Daryll Brod, MD;  Location: [facility];  Service: Orthopedics;  Laterality: Right;  FAB  . TUBAL LIGATION  1986     OB History    Gravida  2   Para  2   Term      Preterm      AB      Living        SAB      IAB      Ectopic      Multiple      Live Births              Family History  Problem Relation Age  of Onset  . Anesthesia problems Sister        hard to wake up post-op; has polio  . Thyroid disease Mother   . Hypertension Mother   . Cancer Mother   . Anxiety disorder Mother   . Heart disease Father     Social History   Tobacco Use  . Smoking status: Never Smoker  . Smokeless tobacco: Never Used  Vaping Use  . Vaping Use: Never used  Substance Use Topics  . Alcohol use: No  . Drug use: No    Home Medications Prior to Admission medications   Medication Sig Start Date End Date Taking? Authorizing Provider  cyclobenzaprine (FLEXERIL) 10 MG tablet Take 1 tablet (10 mg total) by mouth 2 (two) times daily as needed for muscle spasms. 09/30/20  Yes Noemi Chapel, MD  Accu-Chek FastClix Lancets MISC 1 lancet 11/10/18   [provider]  aspirin EC 81 MG tablet Take 81 mg by mouth daily.    [provider]  Blood Glucose Monitoring Suppl (ACCU-CHEK AVIVA CONNECT) w/Device KIT See admin instructions. 07/18/17   [provider]  calcium carbonate (OSCAL) 1500 (600 Ca) MG TABS tablet 1 tablet with meals    [provider]  cholecalciferol (VITAMIN D) 1000 UNITS tablet Take 1,000 Units by mouth daily.    [provider]  Empagliflozin-linaGLIPtin 25-5 MG TABS Take 1 tablet by mouth daily.     [provider]  etodolac (LODINE) 400 MG tablet Take 400 mg by mouth 2 (two) times daily.    [provider]  gabapentin (NEURONTIN) 300 MG capsule Take 300 mg by mouth 3 (three) times daily.    [provider]  glucose blood test strip 1 strip 07/18/17   [provider]  insulin degludec (TRESIBA) 100 UNIT/ML FlexTouch Pen Inject 90 Units into the skin daily.    [provider]  Insulin Pen Needle (NOVOFINE PEN NEEDLE) 32G X 6 MM MISC 1 pen needle 12/30/15   [provider]  lisinopril-hydrochlorothiazide (ZESTORETIC) 20-12.5 MG tablet TAKE 1 TABLET BY MOUTH EVERY DAY FOR 90 DAYS 08/09/20   [provider]  metFORMIN (GLUCOPHAGE) 1000 MG tablet Take 1,000 mg by mouth 2 (two) times daily with a meal.    [provider]  Multiple Vitamin (MULTIVITAMIN WITH MINERALS) TABS tablet Take 1 tablet by mouth daily.    [provider]  simvastatin (ZOCOR) 20 MG tablet Take 20 mg by mouth daily.    [provider]  venlafaxine (EFFEXOR-XR) 150 MG 24 hr capsule Take 150 mg by mouth daily. AM    [provider]  venlafaxine XR (EFFEXOR-XR) 75 MG 24 hr capsule 1 CAPSULE WITH FOOD ONCE A DAY WITH THE 150 MG ORALLY 90 09/04/20   [provider]  zoledronic acid (RECLAST) 5 MG/100ML SOLN injection 5 ml 12/19/19   [provider]    Allergies    Patient has no known allergies.  Review of Systems   Review of Systems  Constitutional: Negative for fever.  Musculoskeletal: Positive for back pain.  Neurological: Negative for weakness and numbness.    Physical Exam Updated Vital Signs BP 98/61   Pulse 80    Temp 98.3 F (36.8 C) (Oral)   Resp 16   SpO2 100%   Physical Exam Vitals and nursing note reviewed.  Constitutional:      General: She is not in acute distress.    Appearance: She is well-developed.  HENT:  Head: Normocephalic and atraumatic.     Mouth/Throat:     Pharynx: No oropharyngeal exudate.  Eyes:     General: No scleral icterus.       Right eye: No discharge.        Left eye: No discharge.     Conjunctiva/sclera: Conjunctivae normal.     Pupils: Pupils are equal, round, and reactive to light.  Neck:     Thyroid: No thyromegaly.     Vascular: No JVD.  Cardiovascular:     Rate and Rhythm: Normal rate and regular rhythm.     Heart sounds: Normal heart sounds. No murmur heard. No friction rub. No gallop.   Pulmonary:     Effort: Pulmonary effort is normal. No respiratory distress.     Breath sounds: Normal breath sounds. No wheezing or rales.     Comments: The patient does have reproducible tenderness to palpation over the chest wall in the parasternal area mostly on the right.  She is able to take a deep breath without pain, there is no visible abnormalities Chest:     Chest wall: Tenderness present.  Abdominal:     General: Bowel sounds are normal. There is no distension.     Palpations: Abdomen is soft. There is no mass.     Tenderness: There is no abdominal tenderness.  Musculoskeletal:        General: No tenderness. Normal range of motion.     Cervical back: Normal range of motion and neck supple.  Lymphadenopathy:     Cervical: No cervical adenopathy.  Skin:    General: Skin is warm and dry.     Findings: No erythema or rash.  Neurological:     Mental Status: She is alert.     Coordination: Coordination normal.     Comments: The patient is able to move all 4 extremities, in fact she can straight leg raise bilaterally at least 30 degrees off the bed with normal strength, she has normal sensation, she has normal reflexes at the patellar tendons  bilaterally.  She is ambulatory though she does have an antalgic gait  Psychiatric:        Behavior: Behavior normal.     ED Results / Procedures / Treatments   Labs (all labs ordered are listed, but only abnormal results are displayed) Labs Reviewed - No data to display  EKG EKG Interpretation  Date/Time:  Tuesday September 30 2020 10:46:17 EDT Ventricular Rate:  95 PR Interval:  146 QRS Duration: 82 QT Interval:  352 QTC Calculation: 442 R Axis:   6 Text Interpretation: Normal sinus rhythm Possible Inferior infarct , age undetermined Cannot rule out Anterior infarct , age undetermined Abnormal ECG since last tracing no significant change Confirmed by Eber Hong ([id number]) on 09/30/2020 3:47:45 PM   Radiology DG Chest 2 View  Result Date: 09/30/2020 CLINICAL DATA:  Right-sided musculoskeletal chest pain along the sternum/manubrium after feeling a pop. EXAM: CHEST - 2 VIEW COMPARISON:  None. FINDINGS: The heart size and mediastinal contours are within normal limits. Both lungs are clear. The visualized skeletal structures are unremarkable. IMPRESSION: No active cardiopulmonary disease. Electronically Signed   By: Obie Dredge M.D.   On: 09/30/2020 11:45    Procedures Procedures   Medications Ordered in ED Medications  dexamethasone (DECADRON) injection 10 mg (has no administration in time range)    ED Course  I have reviewed the triage vital signs and the nursing notes.  Pertinent labs & imaging results that  were available during my care of the patient were reviewed by me and considered in my medical decision making (see chart for details).    MDM Rules/Calculators/A&P                          The patient was primarily concerned with having an expedited MRI because of her pain, she states that the pain is almost completely resolved when she lays down or sits in a recliner and it is mostly present when she tries to move and get up to an upright position.  She has no  neurologic symptoms to suggest that she has cauda equina, an MRI is not indicated at this time.  I have offered her a muscle relaxer as well as a Decadron injection to bridge her over until she can get her injection in the office.  She is agreeable.  I have also sent a message to the spinal surgeon taking care of the patient so that they can hopefully order an MRI for her as an outpatient.  The patient is agreeable to the plan, her vital signs are reassuring, her chest x-ray shows no signs of pneumothorax pneumonia or rib fracture or sternal abnormalities.  The patient was informed of all of these results  Final Clinical Impression(s) / ED Diagnoses Final diagnoses:  Sternal pain  Chronic midline low back pain without sciatica    Rx / DC Orders ED Discharge Orders         Ordered    cyclobenzaprine (FLEXERIL) 10 MG tablet  2 times daily PRN        09/30/20 1614           Noemi Chapel, MD 09/30/20 1616

## 2020-09-30 NOTE — Discharge Instructions (Signed)
The steroid shot which we have given you should work for the next couple of days, it may significantly improve your pain.  You may also take the muscle relaxer Flexeril up to twice a day as needed but be aware that this may make you drowsy.  You may continue with the gabapentin and the etodolac as needed.  Emergency department for increasing weakness numbness or inability to walk or difficulty urinating.

## 2020-09-30 NOTE — ED Triage Notes (Signed)
Patient here from spine center reporting fall onto her back this past Saturday. Reports she has disc problems and xrays done today at office. While there she felt a pop in breast bone, no cp but PA thinks may be related to rib injury, patient tearful on assessment due to back pain and denies rib pain.

## 2020-10-02 ENCOUNTER — Encounter (INDEPENDENT_AMBULATORY_CARE_PROVIDER_SITE_OTHER): Payer: Self-pay | Admitting: Family Medicine

## 2020-10-02 ENCOUNTER — Other Ambulatory Visit: Payer: Self-pay | Admitting: Neurosurgery

## 2020-10-02 ENCOUNTER — Ambulatory Visit (INDEPENDENT_AMBULATORY_CARE_PROVIDER_SITE_OTHER): Payer: 59 | Admitting: Family Medicine

## 2020-10-02 ENCOUNTER — Other Ambulatory Visit: Payer: Self-pay

## 2020-10-02 VITALS — BP 100/67 | HR 82 | Temp 97.7°F | Ht 64.0 in | Wt 203.0 lb

## 2020-10-02 DIAGNOSIS — M5136 Other intervertebral disc degeneration, lumbar region: Secondary | ICD-10-CM

## 2020-10-02 DIAGNOSIS — E1169 Type 2 diabetes mellitus with other specified complication: Secondary | ICD-10-CM

## 2020-10-02 DIAGNOSIS — Z794 Long term (current) use of insulin: Secondary | ICD-10-CM

## 2020-10-02 DIAGNOSIS — Z9189 Other specified personal risk factors, not elsewhere classified: Secondary | ICD-10-CM | POA: Diagnosis not present

## 2020-10-02 DIAGNOSIS — Z9181 History of falling: Secondary | ICD-10-CM

## 2020-10-02 DIAGNOSIS — E66812 Obesity, class 2: Secondary | ICD-10-CM

## 2020-10-02 DIAGNOSIS — E785 Hyperlipidemia, unspecified: Secondary | ICD-10-CM

## 2020-10-02 DIAGNOSIS — E1159 Type 2 diabetes mellitus with other circulatory complications: Secondary | ICD-10-CM

## 2020-10-02 DIAGNOSIS — I152 Hypertension secondary to endocrine disorders: Secondary | ICD-10-CM

## 2020-10-02 DIAGNOSIS — Z6836 Body mass index (BMI) 36.0-36.9, adult: Secondary | ICD-10-CM

## 2020-10-06 NOTE — Progress Notes (Signed)
Chief Complaint:   OBESITY Morgan Guerra is here to discuss her progress with her obesity treatment plan along with follow-up of her obesity related diagnoses.   Today's visit was #: 2 Starting weight: 214 lbs Starting date: 09/18/2020 Today's weight: 203 lbs Today's date: 10/02/2020 Total lbs lost to date: 11 lbs Body mass index is 34.84 kg/m.  Total weight loss percentage to date: -5.14%  Interim History:  She will be getting an MRI at [facility] for back pain.  She is currently taking Flexeril for muscle relaxation.  She says that her blood sugars are "great".  For 2 nights she did not need any insulin.  Current Meal Plan: the Category 2 Plan for 90-95% of the time.  Current Exercise Plan: None.  Assessment/Plan:   1. Type 2 diabetes mellitus with other specified complication, with long-term current use of insulin (HCC) Diabetes Mellitus: Not at goal. Medication: Evaristo Bury, metformin 1,000 mg twice daily. Issues reviewed: blood sugar goals, complications of diabetes mellitus, hypoglycemia prevention and treatment, exercise, and nutrition.   Plan: The importance of regular follow up with PCP and all other specialists as scheduled was stressed to patient today.  Lab Results  Component Value Date   HGBA1C (H) 11/24/2009    7.4 (NOTE)                                                                       According to the ADA Clinical Practice Recommendations for 2011, when HbA1c is used as a screening test:   >=6.5%   Diagnostic of Diabetes Mellitus           (if abnormal result  is confirmed)  5.7-6.4%   Increased risk of developing Diabetes Mellitus  References:Diagnosis and Classification of Diabetes Mellitus,Diabetes Care,2011,34(Suppl 1):S62-S69 and Standards of Medical Care in         Diabetes - 2011,Diabetes Care,2011,34  (Suppl 1):S11-S61.   Lab Results  Component Value Date   [facility]  11/23/2009    39        Total Cholesterol/HDL:CHD Risk Coronary Heart Disease Risk  Table                     Men   Women  1/2 Average Risk   3.4   3.3  Average Risk       5.0   4.4  2 X Average Risk   9.6   7.1  3 X Average Risk  23.4   11.0        Use the calculated Patient Ratio above and the CHD Risk Table to determine the patient's CHD Risk.        ATP III CLASSIFICATION (LDL):  <100     mg/dL   Optimal  333-545  mg/dL   Near or Above                    Optimal  130-159  mg/dL   Borderline  625-638  mg/dL   High  >937     mg/dL   Very High   CREATININE 0.63 04/12/2016   2. Hyperlipidemia associated with type 2 diabetes mellitus Course: Not at goal. Lipid-lowering medications: Zocor 20 mg daily.   Plan: Dietary  changes: Increase soluble fiber, decrease simple carbohydrates, decrease saturated fat. Exercise changes: Moderate to vigorous-intensity aerobic activity 150 minutes per week or as tolerated. We will continue to monitor along with PCP/specialists as it pertains to her weight loss journey.  Lab Results  Component Value Date   CHOL  11/23/2009    101        ATP III CLASSIFICATION:  <200     mg/dL   Desirable  330-076  mg/dL   Borderline High  >=226    mg/dL   High          HDL 34 (L) 11/23/2009   LDLCALC  11/23/2009    39        Total Cholesterol/HDL:CHD Risk Coronary Heart Disease Risk Table                     Men   Women  1/2 Average Risk   3.4   3.3  Average Risk       5.0   4.4  2 X Average Risk   9.6   7.1  3 X Average Risk  23.4   11.0        Use the calculated Patient Ratio above and the CHD Risk Table to determine the patient's CHD Risk.        ATP III CLASSIFICATION (LDL):  <100     mg/dL   Optimal  333-545  mg/dL   Near or Above                    Optimal  130-159  mg/dL   Borderline  625-638  mg/dL   High  >937     mg/dL   Very High   TRIG 342 11/23/2009   CHOLHDL 3.0 11/23/2009   Lab Results  Component Value Date   ALT 34 11/22/2009   AST 37 11/22/2009   ALKPHOS 80 11/22/2009   BILITOT 0.7 11/22/2009   3.  Hypertension associated with diabetes Not at goal. Medications: lisinopril-HCTZ 20-12.5 mg daily.   Plan: Avoid buying foods that are: processed, frozen, or prepackaged to avoid excess salt. We will watch for signs of hypotension as she continues lifestyle modifications. We will continue to monitor closely alongside her PCP and/or Specialist.    BP Readings from Last 3 Encounters:  10/02/20 100/67  09/30/20 107/62  09/18/20 (!) 85/59   Lab Results  Component Value Date   CREATININE 0.63 04/12/2016   4. History of recent fall Morgan Guerra had a recent fall.  She has been having low back pain since.  She has an MRI of her lumbar spine scheduled soon.  5. At risk for heart disease Due to Morgan Guerra's current state of health and medical condition(s), she is at a higher risk for heart disease.  This puts the patient at much greater risk to subsequently develop cardiopulmonary conditions that can significantly affect patient's quality of life in a negative manner.    At least 8 minutes were spent on counseling Morgan Guerra about these concerns today. Evidence-based interventions for health behavior change were utilized today including the discussion of self monitoring techniques, problem-solving barriers, and SMART goal setting techniques.  Specifically, regarding patient's less desirable eating habits and patterns, we employed the technique of small changes when Morgan Guerra has not been able to fully commit to her prudent nutritional plan.  6. Obesity, current BMI 34.9  Course: Morgan Guerra is currently in the action stage of change. As such, her goal is to continue  with weight loss efforts.   Nutrition goals: She has agreed to the Category 2 Plan.   Exercise goals: As tolerated.  Behavioral modification strategies: increasing lean protein intake, decreasing simple carbohydrates, increasing vegetables and increasing water intake.  Morgan Guerra has agreed to follow-up with our clinic in 3 weeks. She was informed of the  importance of frequent follow-up visits to maximize her success with intensive lifestyle modifications for her multiple health conditions.   Objective:   Blood pressure 100/67, pulse 82, temperature 97.7 F (36.5 C), temperature source Oral, height 5\' 4"  (1.626 m), weight 203 lb (92.1 kg), SpO2 98 %. Body mass index is 34.84 kg/m.  General: Cooperative, alert, well developed, in no acute distress. HEENT: Conjunctivae and lids unremarkable. Cardiovascular: Regular rhythm.  Lungs: Normal work of breathing. Neurologic: No focal deficits.   Lab Results  Component Value Date   CREATININE 0.63 04/12/2016   BUN 14 04/12/2016   NA 140 04/12/2016   K 4.1 04/12/2016   CL 105 04/12/2016   CO2 26 04/12/2016   Lab Results  Component Value Date   ALT 34 11/22/2009   AST 37 11/22/2009   ALKPHOS 80 11/22/2009   BILITOT 0.7 11/22/2009   Lab Results  Component Value Date   HGBA1C (H) 11/24/2009    7.4 (NOTE)                                                                       According to the ADA Clinical Practice Recommendations for 2011, when HbA1c is used as a screening test:   >=6.5%   Diagnostic of Diabetes Mellitus           (if abnormal result  is confirmed)  5.7-6.4%   Increased risk of developing Diabetes Mellitus  References:Diagnosis and Classification of Diabetes Mellitus,Diabetes Care,2011,34(Suppl 1):S62-S69 and Standards of Medical Care in         Diabetes - 2011,Diabetes Care,2011,34  (Suppl 1):S11-S61.   No results found for: INSULIN Lab Results  Component Value Date   TSH 1.853 11/23/2009   Lab Results  Component Value Date   CHOL  11/23/2009    101        ATP III CLASSIFICATION:  <200     mg/dL   Desirable  161-096200-239  mg/dL   Borderline High  >=045>=240    mg/dL   High          HDL 34 (L) 11/23/2009   LDLCALC  11/23/2009    39        Total Cholesterol/HDL:CHD Risk Coronary Heart Disease Risk Table                     Men   Women  1/2 Average Risk   3.4   3.3   Average Risk       5.0   4.4  2 X Average Risk   9.6   7.1  3 X Average Risk  23.4   11.0        Use the calculated Patient Ratio above and the CHD Risk Table to determine the patient's CHD Risk.        ATP III CLASSIFICATION (LDL):  <100     mg/dL   Optimal  100-129  mg/dL   Near or Above                    Optimal  130-159  mg/dL   Borderline  833-383  mg/dL   High  >291     mg/dL   Very High   TRIG 916 11/23/2009   CHOLHDL 3.0 11/23/2009   Lab Results  Component Value Date   WBC 6.9 11/23/2009   HGB 15.5 (H) 05/19/2012   HCT 38.0 05/18/2011   MCV 92.6 11/23/2009   PLT 159 DELTA CHECK NOTED 11/23/2009   Attestation Statements:   Reviewed by clinician on day of visit: allergies, medications, problem list, medical history, surgical history, family history, social history, and previous encounter notes.  I, Insurance claims handler, CMA, am acting as transcriptionist for Helane Rima, DO  I have reviewed the above documentation for accuracy and completeness, and I agree with the above. Helane Rima, DO

## 2020-10-10 ENCOUNTER — Ambulatory Visit
Admission: RE | Admit: 2020-10-10 | Discharge: 2020-10-10 | Disposition: A | Discharge status: Home / Self Care | Payer: 59 | Source: Ambulatory Visit | Attending: Neurosurgery | Admitting: Neurosurgery

## 2020-10-10 DIAGNOSIS — M5136 Other intervertebral disc degeneration, lumbar region: Secondary | ICD-10-CM

## 2020-10-14 ENCOUNTER — Other Ambulatory Visit: Payer: 59

## 2020-10-23 ENCOUNTER — Encounter (INDEPENDENT_AMBULATORY_CARE_PROVIDER_SITE_OTHER): Payer: Self-pay | Admitting: Family Medicine

## 2020-10-23 ENCOUNTER — Ambulatory Visit (INDEPENDENT_AMBULATORY_CARE_PROVIDER_SITE_OTHER): Payer: 59 | Admitting: Family Medicine

## 2020-10-23 ENCOUNTER — Other Ambulatory Visit: Payer: Self-pay

## 2020-10-23 VITALS — BP 94/63 | HR 102 | Temp 98.2°F | Ht 64.0 in | Wt 193.0 lb

## 2020-10-23 DIAGNOSIS — E66812 Obesity, class 2: Secondary | ICD-10-CM

## 2020-10-23 DIAGNOSIS — E119 Type 2 diabetes mellitus without complications: Secondary | ICD-10-CM | POA: Insufficient documentation

## 2020-10-23 DIAGNOSIS — E1165 Type 2 diabetes mellitus with hyperglycemia: Secondary | ICD-10-CM | POA: Diagnosis not present

## 2020-10-23 DIAGNOSIS — Z6836 Body mass index (BMI) 36.0-36.9, adult: Secondary | ICD-10-CM

## 2020-10-23 DIAGNOSIS — Z794 Long term (current) use of insulin: Secondary | ICD-10-CM | POA: Diagnosis not present

## 2020-10-27 NOTE — Progress Notes (Signed)
Chief Complaint:   OBESITY Morgan Guerra is here to discuss her progress with her obesity treatment plan along with follow-up of her obesity related diagnoses. Morgan Guerra is on the Category 2 Plan and states she is following her eating plan approximately 95% of the time. Morgan Guerra states she is not exercising regularly at this time.  Today's visit was #: 3 Starting weight: 214 lbs Starting date: 09/18/2020 Today's weight: 193 lbs Today's date: 10/23/2020 Total lbs lost to date: 21 lbs Total lbs lost since last in-office visit: 10 lbs  Interim History: Morgan Guerra has lost 21 pounds since starting with [place] on 09/18/2020.  She has been off plan a few meals, but other than that has adhered to plan.  She loves the plan.  She is getting in all prescribed protein.  She has reduced her snacking: sweets, chips since strarting our program.  She has been out of work since March 29 for a back issue.   Subjective:   1. Type 2 diabetes mellitus with hyperglycemia, with long-term current use of insulin (HCC) A1c 7.7 on 08/29/2020 at PCP [facility]).  Managed by PCP.  She notes she has not taken her Evaristo Bury a few nights (if CBG <110).  FBG 73-130s.  Denies hypoglycemia.  Lab Results  Component Value Date   HGBA1C (H) 11/24/2009    7.4 (NOTE)                                                                       According to the ADA Clinical Practice Recommendations for 2011, when HbA1c is used as a screening test:   >=6.5%   Diagnostic of Diabetes Mellitus           (if abnormal result  is confirmed)  5.7-6.4%   Increased risk of developing Diabetes Mellitus  References:Diagnosis and Classification of Diabetes Mellitus,Diabetes Care,2011,34(Suppl 1):S62-S69 and Standards of Medical Care in         Diabetes - 2011,Diabetes Care,2011,34  (Suppl 1):S11-S61.   Lab Results  Component Value Date   [facility]  11/23/2009    39        Total Cholesterol/HDL:CHD Risk Coronary Heart Disease Risk Table                     Men   Women   1/2 Average Risk   3.4   3.3  Average Risk       5.0   4.4  2 X Average Risk   9.6   7.1  3 X Average Risk  23.4   11.0        Use the calculated Patient Ratio above and the CHD Risk Table to determine the patient's CHD Risk.        ATP III CLASSIFICATION (LDL):  <100     mg/dL   Optimal  161-096  mg/dL   Near or Above                    Optimal  130-159  mg/dL   Borderline  045-409  mg/dL   High  >811     mg/dL   Very High   CREATININE 0.63 04/12/2016   Assessment/Plan:   1. Type 2 diabetes mellitus with  hyperglycemia, with long-term current use of insulin (HCC) Continue metformin, Tresiba, and empagliflozin-linagliptin.  2. Obesity, current BMI 33.11  Morgan Guerra is currently in the action stage of change. As such, her goal is to continue with weight loss efforts. She has agreed to the Category 2 Plan.   Exercise goals: No exercise has been prescribed at this time.  Behavioral modification strategies: planning for success.  Deberah has agreed to follow-up with our clinic in 3 weeks with Dr. Earlene Plater and in 6 weeks with Adah Salvage, FNP.  Objective:   Blood pressure 94/63, pulse (!) 102, temperature 98.2 F (36.8 C), height 5\' 4"  (1.626 m), weight 193 lb (87.5 kg), SpO2 99 %. Body mass index is 33.13 kg/m.  General: Cooperative, alert, well developed, in no acute distress. HEENT: Conjunctivae and lids unremarkable. Cardiovascular: Regular rhythm.  Lungs: Normal work of breathing. Neurologic: No focal deficits.   Lab Results  Component Value Date   CREATININE 0.63 04/12/2016   BUN 14 04/12/2016   NA 140 04/12/2016   K 4.1 04/12/2016   CL 105 04/12/2016   CO2 26 04/12/2016   Lab Results  Component Value Date   ALT 34 11/22/2009   AST 37 11/22/2009   ALKPHOS 80 11/22/2009   BILITOT 0.7 11/22/2009   Lab Results  Component Value Date   HGBA1C (H) 11/24/2009    7.4 (NOTE)                                                                       According to the ADA  Clinical Practice Recommendations for 2011, when HbA1c is used as a screening test:   >=6.5%   Diagnostic of Diabetes Mellitus           (if abnormal result  is confirmed)  5.7-6.4%   Increased risk of developing Diabetes Mellitus  References:Diagnosis and Classification of Diabetes Mellitus,Diabetes Care,2011,34(Suppl 1):S62-S69 and Standards of Medical Care in         Diabetes - 2011,Diabetes Care,2011,34  (Suppl 1):S11-S61.   No results found for: INSULIN Lab Results  Component Value Date   TSH 1.853 Test methodology is 3rd generation TSH 11/23/2009   Lab Results  Component Value Date   CHOL  11/23/2009    101        ATP III CLASSIFICATION:  <200     mg/dL   Desirable  11/25/2009  mg/dL   Borderline High  324-401    mg/dL   High          HDL 34 (L) 11/23/2009   LDLCALC  11/23/2009    39        Total Cholesterol/HDL:CHD Risk Coronary Heart Disease Risk Table                     Men   Women  1/2 Average Risk   3.4   3.3  Average Risk       5.0   4.4  2 X Average Risk   9.6   7.1  3 X Average Risk  23.4   11.0        Use the calculated Patient Ratio above and the CHD Risk Table to determine the patient's CHD Risk.  ATP III CLASSIFICATION (LDL):  <100     mg/dL   Optimal  161-096  mg/dL   Near or Above                    Optimal  130-159  mg/dL   Borderline  045-409  mg/dL   High  >811     mg/dL   Very High   TRIG 914 11/23/2009   CHOLHDL 3.0 11/23/2009   Lab Results  Component Value Date   WBC 6.9 11/23/2009   HGB 15.5 (H) 05/19/2012   HCT 38.0 05/18/2011   MCV 92.6 11/23/2009   PLT 159 DELTA CHECK NOTED 11/23/2009   Attestation Statements:   Reviewed by clinician on day of visit: allergies, medications, problem list, medical history, surgical history, family history, social history, and previous encounter notes.  I, Insurance claims handler, CMA, am acting as Energy manager for [place], FNP.  I have reviewed the above documentation for accuracy and completeness,  and I agree with the above. -  Jesse Sans, FNP

## 2020-11-13 ENCOUNTER — Other Ambulatory Visit: Payer: Self-pay

## 2020-11-13 ENCOUNTER — Ambulatory Visit (INDEPENDENT_AMBULATORY_CARE_PROVIDER_SITE_OTHER): Payer: 59 | Admitting: Family Medicine

## 2020-11-13 ENCOUNTER — Encounter (INDEPENDENT_AMBULATORY_CARE_PROVIDER_SITE_OTHER): Payer: Self-pay | Admitting: Family Medicine

## 2020-11-13 VITALS — BP 96/64 | HR 83 | Temp 98.6°F | Ht 64.0 in | Wt 193.0 lb

## 2020-11-13 DIAGNOSIS — K760 Fatty (change of) liver, not elsewhere classified: Secondary | ICD-10-CM

## 2020-11-13 DIAGNOSIS — Z6836 Body mass index (BMI) 36.0-36.9, adult: Secondary | ICD-10-CM

## 2020-11-13 DIAGNOSIS — E1159 Type 2 diabetes mellitus with other circulatory complications: Secondary | ICD-10-CM

## 2020-11-13 DIAGNOSIS — Z9189 Other specified personal risk factors, not elsewhere classified: Secondary | ICD-10-CM | POA: Diagnosis not present

## 2020-11-13 DIAGNOSIS — I152 Hypertension secondary to endocrine disorders: Secondary | ICD-10-CM

## 2020-11-13 DIAGNOSIS — Z794 Long term (current) use of insulin: Secondary | ICD-10-CM

## 2020-11-13 DIAGNOSIS — E1169 Type 2 diabetes mellitus with other specified complication: Secondary | ICD-10-CM

## 2020-11-13 DIAGNOSIS — E785 Hyperlipidemia, unspecified: Secondary | ICD-10-CM

## 2020-11-16 NOTE — Progress Notes (Signed)
Chief Complaint:   OBESITY Morgan Guerra is here to discuss her progress with her obesity treatment plan along with follow-up of her obesity related diagnoses.   Today's visit was #: 4 Starting weight: 214 lbs Starting date: 09/18/2020 Total lbs lost to date: 21 lbs Body mass index is 33.13 kg/m.  Total weight loss percentage to date: -9.81%  Interim History: Morgan Guerra just went back to work this week (out for back issue).  No insulin this week.  She will still add insulin if blood sugar goes above 130 or so.  She is very good about checking her blood sugar.   Nutrition Plan: Category 2 Plan for 85% of the time.  Assessment/Plan:   1. Type 2 diabetes mellitus with other specified complication, with long-term current use of insulin (HCC) Diabetes Mellitus: Not at goal. Medication: metformin 1,000 mg twice daily, Tresiba, and empagliflozin-linagliptin, . Issues reviewed: blood sugar goals, complications of diabetes mellitus, hypoglycemia prevention and treatment, exercise, and nutrition.  Blood sugars are tightly controlled, 88-105 according to log.  No insulin needed in the past week.  Note:  She struggled to regulate blood sugar after back pain.  She also had recent UTI.  Plan: The patient will continue to focus on protein-rich, low simple carbohydrate foods. We reviewed the importance of hydration, regular exercise for stress reduction, and restorative sleep.   Lab Results  Component Value Date   HGBA1C (H) 11/24/2009    7.4 (NOTE)                                                                       According to the ADA Clinical Practice Recommendations for 2011, when HbA1c is used as a screening test:   >=6.5%   Diagnostic of Diabetes Mellitus           (if abnormal result  is confirmed)  5.7-6.4%   Increased risk of developing Diabetes Mellitus  References:Diagnosis and Classification of Diabetes Mellitus,Diabetes Care,2011,34(Suppl 1):S62-S69 and Standards of Medical Care in          Diabetes - 2011,Diabetes Care,2011,34  (Suppl 1):S11-S61.   Lab Results  Component Value Date   [facility]  11/23/2009    39        Total Cholesterol/HDL:CHD Risk Coronary Heart Disease Risk Table                     Men   Women  1/2 Average Risk   3.4   3.3  Average Risk       5.0   4.4  2 X Average Risk   9.6   7.1  3 X Average Risk  23.4   11.0        Use the calculated Patient Ratio above and the CHD Risk Table to determine the patient's CHD Risk.        ATP III CLASSIFICATION (LDL):  <100     mg/dL   Optimal  947-654  mg/dL   Near or Above                    Optimal  130-159  mg/dL   Borderline  650-354  mg/dL   High  >656     mg/dL  Very High   CREATININE 0.63 04/12/2016   2. Hypertension associated with diabetes At goal. Medications: lisinopril-HCTZ 20-12.5 mg daily.   Plan: Avoid buying foods that are: processed, frozen, or prepackaged to avoid excess salt. We will watch for signs of hypotension as she continues lifestyle modifications.  BP Readings from Last 3 Encounters:  11/13/20 96/64  10/23/20 94/63  10/02/20 100/67   Lab Results  Component Value Date   CREATININE 0.63 04/12/2016   3. Hyperlipidemia associated with type 2 diabetes mellitus Course: Not at goal. Lipid-lowering medications: Zocor 20 mg daily.   Plan: Dietary changes: Increase soluble fiber, decrease simple carbohydrates, decrease saturated fat. Exercise changes: Moderate to vigorous-intensity aerobic activity 150 minutes per week or as tolerated. We will continue to monitor along with PCP/specialists as it pertains to her weight loss journey.  Lab Results  Component Value Date   CHOL  11/23/2009    101        ATP III CLASSIFICATION:  <200     mg/dL   Desirable  517-616  mg/dL   Borderline High  >=073    mg/dL   High          HDL 34 (L) 11/23/2009   LDLCALC  11/23/2009    39        Total Cholesterol/HDL:CHD Risk Coronary Heart Disease Risk Table                     Men   Women  1/2  Average Risk   3.4   3.3  Average Risk       5.0   4.4  2 X Average Risk   9.6   7.1  3 X Average Risk  23.4   11.0        Use the calculated Patient Ratio above and the CHD Risk Table to determine the patient's CHD Risk.        ATP III CLASSIFICATION (LDL):  <100     mg/dL   Optimal  710-626  mg/dL   Near or Above                    Optimal  130-159  mg/dL   Borderline  948-546  mg/dL   High  >270     mg/dL   Very High   TRIG 350 11/23/2009   CHOLHDL 3.0 11/23/2009   Lab Results  Component Value Date   ALT 34 11/22/2009   AST 37 11/22/2009   ALKPHOS 80 11/22/2009   BILITOT 0.7 11/22/2009   4. Hepatic steatosis NAFLD is an umbrella term that encompasses a disease spectrum that includes steatosis (fat) without inflammation, steatohepatitis (NASH; fat + inflammation in a characteristic pattern), and cirrhosis. Bland steatosis is felt to be a benign condition, with extremely low to no risk of progression to cirrhosis, whereas NASH can progress to cirrhosis. The mainstay of treatment of NAFLD includes lifestyle modification to achieve weight loss, at least 7% of current body weight. Low carbohydrate diets can be beneficial in improving NAFLD liver histology. Additionally, exercise, even the absence of weight loss can have beneficial effects on the patient's metabolic profile and liver health.   5. At risk for heart disease Due to Morgan Guerra's current state of health and medical condition(s), she is at a higher risk for heart disease.  This puts the patient at much greater risk to subsequently develop cardiopulmonary conditions that can significantly affect patient's quality of life in a negative manner.  At least 8 minutes were spent on counseling Morgan Guerra about these concerns today. Evidence-based interventions for health behavior change were utilized today including the discussion of self monitoring techniques, problem-solving barriers, and SMART goal setting techniques.  Specifically,  regarding patient's less desirable eating habits and patterns, we employed the technique of small changes when Morgan Guerra has not been able to fully commit to her prudent nutritional plan.  6. Obesity, current BMI 33.1  Course: Noora is currently in the action stage of change. As such, her goal is to continue with weight loss efforts.   Nutrition goals: She has agreed to the Category 2 Plan.   Exercise goals: As tolerated.  Behavioral modification strategies: increasing lean protein intake, decreasing simple carbohydrates, increasing vegetables, increasing water intake and decreasing liquid calories.  Latoyna has agreed to follow-up with our clinic in 4 weeks. She was informed of the importance of frequent follow-up visits to maximize her success with intensive lifestyle modifications for her multiple health conditions.   Objective:   Blood pressure 96/64, pulse 83, temperature 98.6 F (37 C), height 5\' 4"  (1.626 m), weight 193 lb (87.5 kg), SpO2 99 %. Body mass index is 33.13 kg/m.  General: Cooperative, alert, well developed, in no acute distress. HEENT: Conjunctivae and lids unremarkable. Cardiovascular: Regular rhythm.  Lungs: Normal work of breathing. Neurologic: No focal deficits.   Lab Results  Component Value Date   CREATININE 0.63 04/12/2016   BUN 14 04/12/2016   NA 140 04/12/2016   K 4.1 04/12/2016   CL 105 04/12/2016   CO2 26 04/12/2016   Lab Results  Component Value Date   ALT 34 11/22/2009   AST 37 11/22/2009   ALKPHOS 80 11/22/2009   BILITOT 0.7 11/22/2009   Lab Results  Component Value Date   HGBA1C (H) 11/24/2009    7.4   Lab Results  Component Value Date   TSH 1.853  11/23/2009   Lab Results  Component Value Date   CHOL  11/23/2009    101        ATP III CLASSIFICATION:  <200     mg/dL   Desirable  11/25/2009  mg/dL   Borderline High  841-660    mg/dL   High          HDL 34 (L) 11/23/2009   LDLCALC  11/23/2009    39        Total Cholesterol/HDL:CHD  Risk Coronary Heart Disease Risk Table                     Men   Women  1/2 Average Risk   3.4   3.3  Average Risk       5.0   4.4  2 X Average Risk   9.6   7.1  3 X Average Risk  23.4   11.0        Use the calculated Patient Ratio above and the CHD Risk Table to determine the patient's CHD Risk.        ATP III CLASSIFICATION (LDL):  <100     mg/dL   Optimal  11/25/2009  mg/dL   Near or Above                    Optimal  130-159  mg/dL   Borderline  160-109  mg/dL   High  323-557     mg/dL   Very High   TRIG >322 11/23/2009   CHOLHDL 3.0 11/23/2009   Lab  Results  Component Value Date   WBC 6.9 11/23/2009   HGB 15.5 (H) 05/19/2012   HCT 38.0 05/18/2011   MCV 92.6 11/23/2009   PLT 159 DELTA CHECK NOTED 11/23/2009   Attestation Statements:   Reviewed by clinician on day of visit: allergies, medications, problem list, medical history, surgical history, family history, social history, and previous encounter notes.  I, Insurance claims handlerAmber Agner, CMA, am acting as transcriptionist for Helane RimaErica Rasaan Brotherton, DO  I have reviewed the above documentation for accuracy and completeness, and I agree with the above. Helane Rima-  Marck Mcclenny, DO

## 2020-12-02 ENCOUNTER — Encounter (INDEPENDENT_AMBULATORY_CARE_PROVIDER_SITE_OTHER): Payer: Self-pay | Admitting: Physician Assistant

## 2020-12-02 ENCOUNTER — Ambulatory Visit (INDEPENDENT_AMBULATORY_CARE_PROVIDER_SITE_OTHER): Payer: 59 | Admitting: Physician Assistant

## 2020-12-02 ENCOUNTER — Other Ambulatory Visit: Payer: Self-pay

## 2020-12-02 VITALS — BP 90/61 | HR 86 | Temp 98.3°F | Ht 64.0 in | Wt 186.0 lb

## 2020-12-02 DIAGNOSIS — Z794 Long term (current) use of insulin: Secondary | ICD-10-CM

## 2020-12-02 DIAGNOSIS — E1169 Type 2 diabetes mellitus with other specified complication: Secondary | ICD-10-CM | POA: Diagnosis not present

## 2020-12-02 DIAGNOSIS — I152 Hypertension secondary to endocrine disorders: Secondary | ICD-10-CM

## 2020-12-02 DIAGNOSIS — Z6836 Body mass index (BMI) 36.0-36.9, adult: Secondary | ICD-10-CM | POA: Diagnosis not present

## 2020-12-02 DIAGNOSIS — E1159 Type 2 diabetes mellitus with other circulatory complications: Secondary | ICD-10-CM | POA: Diagnosis not present

## 2020-12-03 NOTE — Progress Notes (Signed)
Chief Complaint:   OBESITY Morgan Guerra is here to discuss her progress with her obesity treatment plan along with follow-up of her obesity related diagnoses. Morgan Guerra is on the Category 2 Plan and states she is following her eating plan approximately 90% of the time. [place]   Today's visit was #: 5 Starting weight: 214 lbs Starting date: 09/18/2020 Today's weight: 186 lbs Today's date: 12/02/2020 Total lbs lost to date: 28 Total lbs lost since last in-office visit: 6  Interim History: Morgan Guerra continues to do well with weight loss. She reports no issues with the plan. [place]. Her hunger is controlled.  Subjective:   1. Type 2 diabetes mellitus with other specified complication, with long-term current use of insulin (HCC) Morgan Guerra is on Tresiba, metformin, and Empagliflozin-linagliptin. She has only taken insulin twice since her last visit.  2. Hypertension associated with diabetes Morgan Guerra is taking 1/2 of Zestoretic. Her blood pressure continue to be slightly low, and she is asymptomatic.  Assessment/Plan:   1. Type 2 diabetes mellitus with other specified complication, with long-term current use of insulin (HCC) Morgan Guerra will continue to monitor her blood sugars. Good blood sugar control is important to decrease the likelihood of diabetic complications such as nephropathy, neuropathy, limb loss, blindness, coronary artery disease, and death. Intensive lifestyle modification including diet, exercise and weight loss are the first line of treatment for diabetes.   2. Hypertension associated with diabetes Morgan Guerra is to cut Zestoretic in half, and will continue to monitor her blood pressure. She is to check it at home and bring in diaries. She will continue working on healthy weight loss and exercise to improve blood pressure control. We will watch for signs of hypotension as she continues her lifestyle modifications.  3.  BMI 36.0-36.9,adult Morgan Guerra is currently in the action stage of change. As such, her goal is to continue with weight loss efforts. She has agreed to the Category 2 Plan.   Exercise goals: As is.  Behavioral modification strategies: meal planning and cooking strategies and keeping healthy foods in the home.  Morgan Guerra has agreed to follow-up with our clinic in 3 weeks. She was informed of the importance of frequent follow-up visits to maximize her success with intensive lifestyle modifications for her multiple health conditions.   Objective:   Blood pressure 90/61, pulse 86, temperature 98.3 F (36.8 C), height 5\' 4"  (1.626 m), weight 186 lb (84.4 kg), SpO2 97 %. Body mass index is 31.93 kg/m.  General: Cooperative, alert, well developed, in no acute distress. HEENT: Conjunctivae and lids unremarkable. Cardiovascular: Regular rhythm.  Lungs: Normal work of breathing. Neurologic: No focal deficits.   Lab Results  Component Value Date   CREATININE 0.63 04/12/2016   BUN 14 04/12/2016   NA 140 04/12/2016   K 4.1 04/12/2016   CL 105 04/12/2016   CO2 26 04/12/2016   Lab Results  Component Value Date   ALT 34 11/22/2009   AST 37 11/22/2009   ALKPHOS 80 11/22/2009   BILITOT 0.7 11/22/2009   Lab Results  Component Value Date   HGBA1C (H) 11/24/2009    7.4 (NOTE)  According to the ADA Clinical Practice Recommendations for 2011, when HbA1c is used as a screening test:   >=6.5%   Diagnostic of Diabetes Mellitus           (if abnormal result  is confirmed)  5.7-6.4%   Increased risk of developing Diabetes Mellitus  References:Diagnosis and Classification of Diabetes Mellitus,Diabetes Care,2011,34(Suppl 1):S62-S69 and Standards of Medical Care in         Diabetes - 2011,Diabetes Care,2011,34  (Suppl 1):S11-S61.   No results found for: INSULIN  Lab Results  Component Value Date   CHOL  11/23/2009    101        ATP III  CLASSIFICATION:  <200     mg/dL   Desirable  630-160  mg/dL   Borderline High  >=109    mg/dL   High          HDL 34 (L) 11/23/2009   LDLCALC  11/23/2009    39        Total Cholesterol/HDL:CHD Risk Coronary Heart Disease Risk Table                     Men   Women  1/2 Average Risk   3.4   3.3  Average Risk       5.0   4.4  2 X Average Risk   9.6   7.1  3 X Average Risk  23.4   11.0        Use the calculated Patient Ratio above and the CHD Risk Table to determine the patient's CHD Risk.        ATP III CLASSIFICATION (LDL):  <100     mg/dL   Optimal  323-557  mg/dL   Near or Above                    Optimal  130-159  mg/dL   Borderline  322-025  mg/dL   High  >427     mg/dL   Very High   TRIG 062 11/23/2009   CHOLHDL 3.0 11/23/2009   Lab Results  Component Value Date   WBC 6.9 11/23/2009   HGB 15.5 (H) 05/19/2012   HCT 38.0 05/18/2011   MCV 92.6 11/23/2009   PLT 159 DELTA CHECK NOTED 11/23/2009   No results found for: IRON, TIBC, FERRITIN  Attestation Statements:   Reviewed by clinician on day of visit: allergies, medications, problem list, medical history, surgical history, family history, social history, and previous encounter notes.  Time spent on visit including pre-visit chart review and post-visit care and charting was 30 minutes.    Trude Mcburney, am acting as transcriptionist for [employer], PA-C.  I have reviewed the above documentation for accuracy and completeness, and I agree with the above. -  *Alois Cliche, PA-C

## 2020-12-04 ENCOUNTER — Ambulatory Visit (INDEPENDENT_AMBULATORY_CARE_PROVIDER_SITE_OTHER): Payer: 59 | Admitting: Family Medicine

## 2020-12-18 ENCOUNTER — Encounter (INDEPENDENT_AMBULATORY_CARE_PROVIDER_SITE_OTHER): Payer: Self-pay

## 2020-12-23 ENCOUNTER — Encounter (INDEPENDENT_AMBULATORY_CARE_PROVIDER_SITE_OTHER): Payer: Self-pay | Admitting: Family Medicine

## 2020-12-23 ENCOUNTER — Ambulatory Visit (INDEPENDENT_AMBULATORY_CARE_PROVIDER_SITE_OTHER): Payer: 59 | Admitting: Family Medicine

## 2020-12-23 ENCOUNTER — Other Ambulatory Visit: Payer: Self-pay

## 2020-12-23 VITALS — BP 106/69 | HR 83 | Temp 98.2°F | Ht 64.0 in | Wt 185.0 lb

## 2020-12-23 DIAGNOSIS — F3289 Other specified depressive episodes: Secondary | ICD-10-CM | POA: Diagnosis not present

## 2020-12-23 DIAGNOSIS — Z9189 Other specified personal risk factors, not elsewhere classified: Secondary | ICD-10-CM | POA: Diagnosis not present

## 2020-12-23 DIAGNOSIS — Z794 Long term (current) use of insulin: Secondary | ICD-10-CM | POA: Diagnosis not present

## 2020-12-23 DIAGNOSIS — E1165 Type 2 diabetes mellitus with hyperglycemia: Secondary | ICD-10-CM | POA: Diagnosis not present

## 2020-12-23 DIAGNOSIS — Z6836 Body mass index (BMI) 36.0-36.9, adult: Secondary | ICD-10-CM

## 2020-12-23 MED ORDER — TOPIRAMATE 25 MG PO TABS: 25.0000 mg | ORAL_TABLET | Freq: Every day | ORAL | 0 refills | Status: DC

## 2020-12-24 ENCOUNTER — Encounter (INDEPENDENT_AMBULATORY_CARE_PROVIDER_SITE_OTHER): Payer: Self-pay | Admitting: Family Medicine

## 2020-12-24 NOTE — Progress Notes (Signed)
Chief Complaint:   OBESITY Morgan Guerra is here to discuss her progress with her obesity treatment plan along with follow-up of her obesity related diagnoses. Morgan Guerra is on the Category 2 Plan and states she is following her eating plan approximately 75% of the time. Morgan Guerra states she is not currently exercising.  Today's visit was #: 6 Starting weight: 214 lbs Starting date: 09/18/2020 Today's weight: 185 lbs Today's date: 12/23/2020 Total lbs lost to date: 29 Total lbs lost since last in-office visit: 1  Interim History: Morgan Guerra notes she is struggling to stick to the plan [place] and recent holiday (Mother's Day). Weekends and nights are hard for her.  She is on plan at breakfast and lunch.  Subjective:   1. Type 2 diabetes mellitus with hyperglycemia, with long-term current use of insulin (HCC) Morgan Guerra's A1c is elevated at 7.7. DM is managed by Dr. Clelia Croft. She is taking 10 units of Tresiba if CBG is >150 at night, per Dr. Earlene Plater. She has only had to take 6 doses since May 12. She is on Empagliflozin-linagliptin and Metformin.   Lab Results  Component Value Date   HGBA1C (H) 11/24/2009    7.4 (NOTE)                                                                       According to the ADA Clinical Practice Recommendations for 2011, when HbA1c is used as a screening test:   >=6.5%   Diagnostic of Diabetes Mellitus           (if abnormal result  is confirmed)  5.7-6.4%   Increased risk of developing Diabetes Mellitus  References:Diagnosis and Classification of Diabetes Mellitus,Diabetes Care,2011,34(Suppl 1):S62-S69 and Standards of Medical Care in         Diabetes - 2011,Diabetes Care,2011,34  (Suppl 1):S11-S61.   Lab Results  Component Value Date   [facility]  11/23/2009    39        Total Cholesterol/HDL:CHD Risk Coronary Heart Disease Risk Table                     Men   Women  1/2 Average Risk   3.4   3.3  Average Risk       5.0   4.4  2 X Average Risk   9.6    7.1  3 X Average Risk  23.4   11.0        Use the calculated Patient Ratio above and the CHD Risk Table to determine the patient's CHD Risk.        ATP III CLASSIFICATION (LDL):  <100     mg/dL   Optimal  017-510  mg/dL   Near or Above                    Optimal  130-159  mg/dL   Borderline  258-527  mg/dL   High  >782     mg/dL   Very High   CREATININE 0.63 04/12/2016   No results found for: INSULIN  2. Other depression with emotional eating Morgan Guerra notes cravings, especially at night and on weekends. [place] and they  tend to indulge in high calorie unhealthy foods. and it makes it very hard for her to stick to plan.   3. At risk for side effect of medication Morgan Guerra is at risk for side effects of mediation due to starting Topamax.   Assessment/Plan:   1. Type 2 diabetes mellitus with hyperglycemia, with long-term current use of insulin (HCC) Continue medications at current doses.  2. Other depression with emotional eating New Rx: Topamax 25 mg with supper, as written below. She has no history of glaucoma or kidney stones. - topiramate (TOPAMAX) 25 MG tablet; Take 1 tablet (25 mg total) by mouth daily with supper.  Dispense: 30 tablet; Refill: 0  3. At risk for side effect of medication Morgan Guerra was given approximately 15 minutes of drug side effect counseling today.  We discussed side effect possibility and risk versus benefits. Morgan Guerra agreed to the medication and will contact this office if these side effects are intolerable.  Repetitive spaced learning was employed today to elicit superior memory formation and behavioral change.   4. Obesity, current BMI 31.74  Morgan Guerra is currently in the action stage of change. As such, her goal is to continue with weight loss efforts. She has agreed to the Category 2 Plan and keeping a food journal and adhering to recommended goals of 400-600 calories and 35 g protein with supper.   Exercise goals: No exercise has been  prescribed at this time.  Behavioral modification strategies: planning for success.  Morgan Guerra has agreed to follow-up with our clinic in 3 weeks.  Objective:   Blood pressure 106/69, pulse 83, temperature 98.2 F (36.8 C), height 5\' 4"  (1.626 m), weight 185 lb (83.9 kg), SpO2 98 %. Body mass index is 31.76 kg/m.  General: Cooperative, alert, well developed, in no acute distress. HEENT: Conjunctivae and lids unremarkable. Cardiovascular: Regular rhythm.  Lungs: Normal work of breathing. Neurologic: No focal deficits.   Lab Results  Component Value Date   CREATININE 0.63 04/12/2016   BUN 14 04/12/2016   NA 140 04/12/2016   K 4.1 04/12/2016   CL 105 04/12/2016   CO2 26 04/12/2016   Lab Results  Component Value Date   ALT 34 11/22/2009   AST 37 11/22/2009   ALKPHOS 80 11/22/2009   BILITOT 0.7 11/22/2009   Lab Results  Component Value Date   HGBA1C (H) 11/24/2009    7.4 (NOTE)                                                                       According to the ADA Clinical Practice Recommendations for 2011, when HbA1c is used as a screening test:   >=6.5%   Diagnostic of Diabetes Mellitus           (if abnormal result  is confirmed)  5.7-6.4%   Increased risk of developing Diabetes Mellitus  References:Diagnosis and Classification of Diabetes Mellitus,Diabetes Care,2011,34(Suppl 1):S62-S69 and Standards of Medical Care in         Diabetes - 2011,Diabetes Care,2011,34  (Suppl 1):S11-S61.   No results found for: INSULIN Lab Results  Component Value Date   TSH 1.853 Test methodology is 3rd generation TSH 11/23/2009   Lab Results  Component Value Date   CHOL  11/23/2009  101        ATP III CLASSIFICATION:  <200     mg/dL   Desirable  627-035  mg/dL   Borderline High  >=009    mg/dL   High          HDL 34 (L) 11/23/2009   LDLCALC  11/23/2009    39        Total Cholesterol/HDL:CHD Risk Coronary Heart Disease Risk Table                     Men   Women  1/2 Average  Risk   3.4   3.3  Average Risk       5.0   4.4  2 X Average Risk   9.6   7.1  3 X Average Risk  23.4   11.0        Use the calculated Patient Ratio above and the CHD Risk Table to determine the patient's CHD Risk.        ATP III CLASSIFICATION (LDL):  <100     mg/dL   Optimal  381-829  mg/dL   Near or Above                    Optimal  130-159  mg/dL   Borderline  937-169  mg/dL   High  >678     mg/dL   Very High   TRIG 938 11/23/2009   CHOLHDL 3.0 11/23/2009   Lab Results  Component Value Date   WBC 6.9 11/23/2009   HGB 15.5 (H) 05/19/2012   HCT 38.0 05/18/2011   MCV 92.6 11/23/2009   PLT 159 DELTA CHECK NOTED 11/23/2009   No results found for: IRON, TIBC, FERRITIN  Attestation Statements:   Reviewed by clinician on day of visit: allergies, medications, problem list, medical history, surgical history, family history, social history, and previous encounter notes.  Edmund Hilda, CMA, am acting as Energy manager for [place], FNP.  I have reviewed the above documentation for accuracy and completeness, and I agree with the above. -  Jesse Sans, FNP

## 2020-12-25 ENCOUNTER — Ambulatory Visit (INDEPENDENT_AMBULATORY_CARE_PROVIDER_SITE_OTHER): Payer: 59 | Admitting: Family Medicine

## 2021-01-15 ENCOUNTER — Other Ambulatory Visit (INDEPENDENT_AMBULATORY_CARE_PROVIDER_SITE_OTHER): Payer: Self-pay | Admitting: Family Medicine

## 2021-01-15 ENCOUNTER — Other Ambulatory Visit: Payer: Self-pay

## 2021-01-15 ENCOUNTER — Encounter (INDEPENDENT_AMBULATORY_CARE_PROVIDER_SITE_OTHER): Payer: Self-pay | Admitting: Physician Assistant

## 2021-01-15 ENCOUNTER — Ambulatory Visit (INDEPENDENT_AMBULATORY_CARE_PROVIDER_SITE_OTHER): Payer: 59 | Admitting: Physician Assistant

## 2021-01-15 VITALS — BP 113/68 | HR 80 | Temp 98.1°F | Ht 64.0 in | Wt 183.0 lb

## 2021-01-15 DIAGNOSIS — F3289 Other specified depressive episodes: Secondary | ICD-10-CM

## 2021-01-15 DIAGNOSIS — Z9189 Other specified personal risk factors, not elsewhere classified: Secondary | ICD-10-CM

## 2021-01-15 DIAGNOSIS — Z6836 Body mass index (BMI) 36.0-36.9, adult: Secondary | ICD-10-CM

## 2021-01-15 DIAGNOSIS — E1165 Type 2 diabetes mellitus with hyperglycemia: Secondary | ICD-10-CM | POA: Diagnosis not present

## 2021-01-15 DIAGNOSIS — Z794 Long term (current) use of insulin: Secondary | ICD-10-CM | POA: Diagnosis not present

## 2021-01-26 NOTE — Progress Notes (Signed)
Chief Complaint:   OBESITY Morgan Guerra is here to discuss her progress with her obesity treatment plan along with follow-up of her obesity related diagnoses. Morgan Guerra is on the Category 2 Plan and keeping a food journal and adhering to recommended goals of 400-600 calories and 35 grams of protein at supper daily and states she is following her eating plan approximately 75% of the time. Morgan Guerra states she is doing 0 minutes 0 times per week.  Today's visit was #: 7 Starting weight: 214 lbs Starting date: 09/18/2020 Today's weight: 183 lbs Today's date: 01/15/2021 Total lbs lost to date: 31 Total lbs lost since last in-office visit: 2  Interim History: Morgan Gu[place] the lake again [place] and she states that she struggled with dinner meals due to the food her family eats. She is doing a better job overall eating tempting foods in moderation.  Subjective:   1. Type 2 diabetes mellitus with hyperglycemia, with long-term current use of insulin (HCC) Morgan Guerra's last A1c was 7.7. She is followed by Dr. Clelia Croft. She only took her insulin twice during the [place] the lake, and then once since she has been back.  2. Other depression with emotional eating Morgan Guerra is on Topamax, but she only took it for 1 week, as she [place] it at the lake. She feels like it helped decrease her cravings when she was taking it.  3. At risk for heart disease Morgan Guerra is at a higher than average risk for cardiovascular disease due to obesity.   Assessment/Plan:   1. Type 2 diabetes mellitus with hyperglycemia, with long-term current use of insulin Morgan Guerra) Morgan Guerra will continue to follow up with Dr. Clelia Croft. Good blood sugar control is important to decrease the likelihood of diabetic complications such as nephropathy, neuropathy, limb loss, blindness, coronary artery disease, and death. Intensive lifestyle modification including diet, exercise and weight loss are the first line of treatment for diabetes.   2. Other depression with emotional  eating Behavior modification techniques were discussed today to help Morgan Guerra deal with her emotional/non-hunger eating behaviors. Morgan Guerra is to take Topamax as prescribed. Orders and follow up as documented in patient record.   3. At risk for heart disease Morgan Guerra was given approximately 15 minutes of coronary artery disease prevention counseling today. She is 65 y.o. female and has risk factors for heart disease including obesity. We discussed intensive lifestyle modifications today with an emphasis on specific weight loss instructions and strategies.   Repetitive spaced learning was employed today to elicit superior memory formation and behavioral change.  4. Obesity, current BMI 31.4 Morgan Guerra is currently in the action stage of change. As such, her goal is to continue with weight loss efforts. She has agreed to the Category 2 Plan.   Exercise goals: No exercise has been prescribed at this time.  Behavioral modification strategies: meal planning and cooking strategies and keeping healthy foods in the home.  Morgan Guerra has agreed to follow-up with our clinic in 3 weeks. She was informed of the importance of frequent follow-up visits to maximize her success with intensive lifestyle modifications for her multiple health conditions.   Objective:   Blood pressure 113/68, pulse 80, temperature 98.1 F (36.7 C), height 5\' 4"  (1.626 m), weight 183 lb (83 kg), SpO2 99 %. Body mass index is 31.41 kg/m.  General: Cooperative, alert, well developed, in no acute distress. HEENT: Conjunctivae and lids unremarkable. Cardiovascular: Regular rhythm.  Lungs: Normal work of breathing. Neurologic: No focal deficits.   Lab  Results  Component Value Date   CREATININE 0.63 04/12/2016   BUN 14 04/12/2016   NA 140 04/12/2016   K 4.1 04/12/2016   CL 105 04/12/2016   CO2 26 04/12/2016   Lab Results  Component Value Date   ALT 34 11/22/2009   AST 37 11/22/2009   ALKPHOS 80 11/22/2009   BILITOT 0.7 11/22/2009    Lab Results  Component Value Date   HGBA1C (H) 11/24/2009    7.4 (NOTE)                                                                       According to the ADA Clinical Practice Recommendations for 2011, when HbA1c is used as a screening test:   >=6.5%   Diagnostic of Diabetes Mellitus           (if abnormal result  is confirmed)  5.7-6.4%   Increased risk of developing Diabetes Mellitus  References:Diagnosis and Classification of Diabetes Mellitus,Diabetes Care,2011,34(Suppl 1):S62-S69 and Standards of Medical Care in         Diabetes - 2011,Diabetes Care,2011,34  (Suppl 1):S11-S61.   No results found for: INSULIN  Lab Results  Component Value Date   CHOL  11/23/2009    101        ATP III CLASSIFICATION:  <200     mg/dL   Desirable  324-401  mg/dL   Borderline High  >=027    mg/dL   High          HDL 34 (L) 11/23/2009   LDLCALC  11/23/2009    39        Total Cholesterol/HDL:CHD Risk Coronary Heart Disease Risk Table                     Men   Women  1/2 Average Risk   3.4   3.3  Average Risk       5.0   4.4  2 X Average Risk   9.6   7.1  3 X Average Risk  23.4   11.0        Use the calculated Patient Ratio above and the CHD Risk Table to determine the patient's CHD Risk.        ATP III CLASSIFICATION (LDL):  <100     mg/dL   Optimal  253-664  mg/dL   Near or Above                    Optimal  130-159  mg/dL   Borderline  403-474  mg/dL   High  >259     mg/dL   Very High   TRIG 563 11/23/2009   CHOLHDL 3.0 11/23/2009   No results found for: VD25OH Lab Results  Component Value Date   WBC 6.9 11/23/2009   HGB 15.5 (H) 05/19/2012   HCT 38.0 05/18/2011   MCV 92.6 11/23/2009   PLT 159 DELTA CHECK NOTED 11/23/2009   No results found for: IRON, TIBC, FERRITIN  Attestation Statements:   Reviewed by clinician on day of visit: allergies, medications, problem list, medical history, surgical history, family history, social history, and previous encounter  notes.   Trude Mcburney, am acting as transcriptionist for [employer],  PA-C.  I have reviewed the above documentation for accuracy and completeness, and I agree with the above. Abby Potash, PA-C

## 2021-02-04 ENCOUNTER — Ambulatory Visit (INDEPENDENT_AMBULATORY_CARE_PROVIDER_SITE_OTHER): Payer: 59 | Admitting: Family Medicine

## 2021-02-04 ENCOUNTER — Encounter (INDEPENDENT_AMBULATORY_CARE_PROVIDER_SITE_OTHER): Payer: Self-pay

## 2021-02-04 ENCOUNTER — Other Ambulatory Visit: Payer: Self-pay

## 2021-02-04 ENCOUNTER — Encounter (INDEPENDENT_AMBULATORY_CARE_PROVIDER_SITE_OTHER): Payer: Self-pay | Admitting: Family Medicine

## 2021-02-04 ENCOUNTER — Telehealth (INDEPENDENT_AMBULATORY_CARE_PROVIDER_SITE_OTHER): Payer: Self-pay

## 2021-02-04 VITALS — BP 95/60 | HR 74 | Temp 97.8°F | Ht 64.0 in | Wt 181.0 lb

## 2021-02-04 DIAGNOSIS — Z794 Long term (current) use of insulin: Secondary | ICD-10-CM

## 2021-02-04 DIAGNOSIS — E1165 Type 2 diabetes mellitus with hyperglycemia: Secondary | ICD-10-CM

## 2021-02-04 DIAGNOSIS — Z9189 Other specified personal risk factors, not elsewhere classified: Secondary | ICD-10-CM

## 2021-02-04 DIAGNOSIS — K760 Fatty (change of) liver, not elsewhere classified: Secondary | ICD-10-CM | POA: Diagnosis not present

## 2021-02-04 DIAGNOSIS — E1169 Type 2 diabetes mellitus with other specified complication: Secondary | ICD-10-CM

## 2021-02-04 DIAGNOSIS — E66812 Obesity, class 2: Secondary | ICD-10-CM

## 2021-02-04 DIAGNOSIS — I152 Hypertension secondary to endocrine disorders: Secondary | ICD-10-CM

## 2021-02-04 DIAGNOSIS — E1159 Type 2 diabetes mellitus with other circulatory complications: Secondary | ICD-10-CM

## 2021-02-04 DIAGNOSIS — Z6836 Body mass index (BMI) 36.0-36.9, adult: Secondary | ICD-10-CM

## 2021-02-04 DIAGNOSIS — R632 Polyphagia: Secondary | ICD-10-CM

## 2021-02-04 DIAGNOSIS — F32A Depression, unspecified: Secondary | ICD-10-CM | POA: Insufficient documentation

## 2021-02-04 DIAGNOSIS — S32020A Wedge compression fracture of second lumbar vertebra, initial encounter for closed fracture: Secondary | ICD-10-CM | POA: Insufficient documentation

## 2021-02-04 DIAGNOSIS — E785 Hyperlipidemia, unspecified: Secondary | ICD-10-CM

## 2021-02-04 MED ORDER — TIRZEPATIDE 2.5 MG/0.5ML ~~LOC~~ SOAJ: 2.5000 mg | SUBCUTANEOUS | 0 refills | Status: DC

## 2021-02-04 NOTE — Telephone Encounter (Signed)
Pt called in and stated that they looked up a medication that she was told to check out and the pt is requesting a call back from the nurse. The pt stated that the medicine [name]. Please advise

## 2021-02-04 NOTE — Telephone Encounter (Signed)
Dr.Wallace °

## 2021-02-04 NOTE — Telephone Encounter (Signed)
Pt was given information on haw to access the [employer] saving card.

## 2021-02-04 NOTE — Progress Notes (Signed)
Chief Complaint:   OBESITY Morgan Guerra is here to discuss her progress with her obesity treatment plan along with follow-up of her obesity related diagnoses. See Medical Weight Management Flowsheet for complete bioelectrical impedance results.  Today's visit was #: 8 Starting weight: 214 lbs Starting date: 09/18/2020 Today's weight: 181 lbs Today's date: 02/04/2021 Weight change since last visit: 2 lbs Total lbs lost to date: 33 lbs Body mass index is 31.07 kg/m.  Total weight loss percentage to date: -15.12%  Interim History: Morgan Guerra's blood sugar this morning was 148.  She takes 10 units insulin, but says it is "not very often".  She endorses polyphagia. Nutrition Plan: the Category 2 Plan for 90% of the time. Activity:  None at this time.  Assessment/Plan:   1. Type 2 diabetes mellitus with hyperglycemia, with long-term current use of insulin (HCC) Diabetes Mellitus: Not at goal. Medication: metformin 1,000 mg twice daily, Tresiba. She is rarely using [place].  Blood glucose is usually in the low 100s.    Plan:  Start [name] 2.5 mg subcutaneously weekly.  Okay to decrease Tesiba by half or stop if blood sugar is low.  The patient will continue to focus on protein-rich, low simple carbohydrate foods. We reviewed the importance of hydration, regular exercise for stress reduction, and restorative sleep.   Lab Results  Component Value Date   HGBA1C (H) 11/24/2009    7.4 (NOTE)                                                                       According to the ADA Clinical Practice Recommendations for 2011, when HbA1c is used as a screening test:   >=6.5%   Diagnostic of Diabetes Mellitus           (if abnormal result  is confirmed)  5.7-6.4%   Increased risk of developing Diabetes Mellitus  References:Diagnosis and Classification of Diabetes Mellitus,Diabetes Care,2011,34(Suppl 1):S62-S69 and Standards of Medical Care in         Diabetes - 2011,Diabetes Care,2011,34  (Suppl  1):S11-S61.   Lab Results  Component Value Date   [facility]        Total Cholesterol/HDL:CHD Risk Coronary Heart Disease Risk Table                     Men   Women  1/2 Average Risk   3.4   3.3  Average Risk       5.0   4.4  2 X Average Risk   9.6   7.1  3 X Average Risk  23.4   11.0        Use the calculated Patient Ratio above and the CHD Risk Table to determine the patient's CHD Risk.        ATP III CLASSIFICATION (LDL):  <100     mg/dL   Optimal  161-096100-129  mg/dL   Near or Above                    Optimal  130-159  mg/dL   Borderline  045-409160-189  mg/dL   High  >811>190     mg/dL   Very High   CREATININE 0.63  04/12/2016   - Start tirzepatide [facility]) 2.5 MG/0.5ML Pen; Inject 2.5 mg into the skin once a week.  Dispense: 2 mL; Refill: 0  2. Hypertension associated with diabetes Low today.  Medications: Zestoretic 20-12.5 mg daily.  No dizziness or fatigue.  She only takes 1/4 tablet of her lisinopril-HCTZ.  Plan: Avoid buying foods that are: processed, frozen, or prepackaged to avoid excess salt. We will watch for signs of hypotension as she continues lifestyle modifications.  BP Readings from Last 3 Encounters:  02/04/21 95/60  01/15/21 113/68  12/23/20 106/69   Lab Results  Component Value Date   CREATININE 0.63 04/12/2016   3. Polyphagia Not at goal. Current treatment: None. She will continue to focus on protein-rich, low simple carbohydrate foods. We reviewed the importance of hydration, regular exercise for stress reduction, and restorative sleep.  Plan:  [facility], as per above, and we will see if this helps with her polyphagia.  4. Hepatic steatosis  Counseling: NAFLD is an umbrella term that encompasses a disease spectrum that includes steatosis (fat) without inflammation, steatohepatitis (NASH; fat + inflammation in a characteristic pattern), and cirrhosis. Bland steatosis is felt to be a benign condition, with extremely low to no risk of  progression to cirrhosis, whereas NASH can progress to cirrhosis. The mainstay of treatment of NAFLD includes lifestyle modification to achieve weight loss, at least 7% of current body weight. Low carbohydrate diets can be beneficial in improving NAFLD liver histology. Additionally, exercise, even the absence of weight loss can have beneficial effects on the patient's metabolic profile and liver health. We recommend that their metabolic comorbidities be aggressively managed, as patients with NAFLD are at increased risk of coronary artery disease.  5. Hyperlipidemia associated with type 2 diabetes mellitus Course: Not at goal. Lipid-lowering medications: Zocor 20 mg daily.   Plan: Dietary changes: Increase soluble fiber, decrease simple carbohydrates, decrease saturated fat. Exercise changes: Moderate to vigorous-intensity aerobic activity 150 minutes per week or as tolerated. We will continue to monitor along with PCP/specialists as it pertains to her weight loss journey.  Lab Results  Component Value Date   CHOL  11/23/2009    101        ATP III CLASSIFICATION:  <200     mg/dL   Desirable  096-283  mg/dL   Borderline High  >=662    mg/dL   High          HDL 34 (L) 11/23/2009   LDLCALC  11/23/2009    39        Total Cholesterol/HDL:CHD Risk Coronary Heart Disease Risk Table                     Men   Women  1/2 Average Risk   3.4   3.3  Average Risk       5.0   4.4  2 X Average Risk   9.6   7.1  3 X Average Risk  23.4   11.0        Use the calculated Patient Ratio above and the CHD Risk Table to determine the patient's CHD Risk.        ATP III CLASSIFICATION (LDL):  <100     mg/dL   Optimal  947-654  mg/dL   Near or Above                    Optimal  130-159  mg/dL   Borderline  650-354  mg/dL  High  >190     mg/dL   Very High   TRIG 161 11/23/2009   CHOLHDL 3.0 11/23/2009   Lab Results  Component Value Date   ALT 34 11/22/2009   AST 37 11/22/2009   ALKPHOS 80 11/22/2009    BILITOT 0.7 11/22/2009   6. At risk for heart disease Due to Nabilah's current state of health and medical condition(s), she is at a higher risk for heart disease.  This puts the patient at much greater risk to subsequently develop cardiopulmonary conditions that can significantly affect patient's quality of life in a negative manner.    At least 8 minutes were spent on counseling Yeraldi about these concerns today. Evidence-based interventions for health behavior change were utilized today including the discussion of self monitoring techniques, problem-solving barriers, and SMART goal setting techniques.  Specifically, regarding patient's less desirable eating habits and patterns, we employed the technique of small changes when Tonjua has not been able to fully commit to her prudent nutritional plan.  7. Obesity, current BMI 31.1  Course: Ginette is currently in the action stage of change. As such, her goal is to continue with weight loss efforts.   Nutrition goals: She has agreed to the Category 2 Plan.   Exercise goals: All adults should avoid inactivity. Some physical activity is better than none, and adults who participate in any amount of physical activity gain some health benefits.  Behavioral modification strategies: increasing lean protein intake, decreasing simple carbohydrates, increasing vegetables, increasing water intake, and emotional eating strategies.  Man has agreed to follow-up with our clinic in 2-3 weeks. She was informed of the importance of frequent follow-up visits to maximize her success with intensive lifestyle modifications for her multiple health conditions.   Objective:   Blood pressure 95/60, pulse 74, temperature 97.8 F (36.6 C), temperature source Oral, height 5\' 4"  (1.626 m), weight 181 lb (82.1 kg), SpO2 97 %. Body mass index is 31.07 kg/m.  General: Cooperative, alert, well developed, in no acute distress. HEENT: Conjunctivae and lids  unremarkable. Cardiovascular: Regular rhythm.  Lungs: Normal work of breathing. Neurologic: No focal deficits.   Lab Results  Component Value Date   CREATININE 0.63 04/12/2016   BUN 14 04/12/2016   NA 140 04/12/2016   K 4.1 04/12/2016   CL 105 04/12/2016   CO2 26 04/12/2016   Lab Results  Component Value Date   ALT 34 11/22/2009   AST 37 11/22/2009   ALKPHOS 80 11/22/2009   BILITOT 0.7 11/22/2009   Lab Results  Component Value Date   HGBA1C (H) 11/24/2009    7.4 (NOTE)                                                                       According to the ADA Clinical Practice Recommendations for 2011, when HbA1c is used as a screening test:   >=6.5%   Diagnostic of Diabetes Mellitus           (if abnormal result  is confirmed)  5.7-6.4%   Increased risk of developing Diabetes Mellitus  References:Diagnosis and Classification of Diabetes Mellitus,Diabetes Care,2011,34(Suppl 1):S62-S69 and Standards of Medical Care in         Diabetes - 2011,Diabetes 2012  (Suppl  1):S11-S61.    Lab Results  Component Value Date   CHOL  11/23/2009    101        ATP III CLASSIFICATION:  <200     mg/dL   Desirable  062-376  mg/dL   Borderline High  >=283    mg/dL   High          HDL 34 (L) 11/23/2009   LDLCALC  11/23/2009    39        Total Cholesterol/HDL:CHD Risk Coronary Heart Disease Risk Table                     Men   Women  1/2 Average Risk   3.4   3.3  Average Risk       5.0   4.4  2 X Average Risk   9.6   7.1  3 X Average Risk  23.4   11.0        Use the calculated Patient Ratio above and the CHD Risk Table to determine the patient's CHD Risk.        ATP III CLASSIFICATION (LDL):  <100     mg/dL   Optimal  151-761  mg/dL   Near or Above                    Optimal  130-159  mg/dL   Borderline  607-371  mg/dL   High  >062     mg/dL   Very High   TRIG 694 11/23/2009   CHOLHDL 3.0 11/23/2009   Lab Results  Component Value Date   WBC 6.9 11/23/2009   HGB 15.5  (H) 05/19/2012   HCT 38.0 05/18/2011   MCV 92.6 11/23/2009   PLT 159 DELTA CHECK NOTED 11/23/2009   Attestation Statements:   Reviewed by clinician on day of visit: allergies, medications, problem list, medical history, surgical history, family history, social history, and previous encounter notes.  I, Insurance claims handler, CMA, am acting as transcriptionist for Helane Rima, DO  I have reviewed the above documentation for accuracy and completeness, and I agree with the above. Helane Rima, DO

## 2021-02-16 NOTE — Telephone Encounter (Signed)
I had not received a prior authorization request for [name]. Prior authorization started. Waiting on response, will notify patient and provider once response received.

## 2021-02-17 ENCOUNTER — Encounter (INDEPENDENT_AMBULATORY_CARE_PROVIDER_SITE_OTHER): Payer: Self-pay

## 2021-02-19 LAB — COMPREHENSIVE METABOLIC PANEL
Albumin: 4.4 (ref 3.5–5.0)
Calcium: 10.2 (ref 8.7–10.7)

## 2021-02-19 LAB — BASIC METABOLIC PANEL
BUN: 24 — AB (ref 4–21)
CO2: 27 — AB (ref 13–22)
Chloride: 101 (ref 99–108)
Creatinine: 0.7 (ref 0.5–1.1)
Glucose: 126
Potassium: 4.3 (ref 3.4–5.3)
Sodium: 140 (ref 137–147)

## 2021-02-19 LAB — MICROALBUMIN, URINE: Microalb, Ur: 1.34

## 2021-02-19 LAB — HEPATIC FUNCTION PANEL
ALT: 18 (ref 7–35)
AST: 17 (ref 13–35)
Alkaline Phosphatase: 62 (ref 25–125)
Bilirubin, Total: 0.9

## 2021-02-19 LAB — LIPID PANEL
Cholesterol: 138 (ref 0–200)
HDL: 51 (ref 35–70)
LDL Cholesterol: 61
Triglycerides: 154 (ref 40–160)

## 2021-02-19 LAB — HEMOGLOBIN A1C: Hemoglobin A1C: 6.6

## 2021-02-25 ENCOUNTER — Ambulatory Visit (INDEPENDENT_AMBULATORY_CARE_PROVIDER_SITE_OTHER): Payer: 59 | Admitting: Physician Assistant

## 2021-03-02 ENCOUNTER — Ambulatory Visit (INDEPENDENT_AMBULATORY_CARE_PROVIDER_SITE_OTHER): Payer: 59 | Admitting: Bariatrics

## 2021-03-19 ENCOUNTER — Other Ambulatory Visit (INDEPENDENT_AMBULATORY_CARE_PROVIDER_SITE_OTHER): Payer: Self-pay | Admitting: Family Medicine

## 2021-03-19 NOTE — Progress Notes (Signed)
error 

## 2021-03-24 ENCOUNTER — Other Ambulatory Visit: Payer: Self-pay

## 2021-03-24 ENCOUNTER — Ambulatory Visit (INDEPENDENT_AMBULATORY_CARE_PROVIDER_SITE_OTHER): Payer: 59 | Admitting: Family Medicine

## 2021-03-24 ENCOUNTER — Encounter (INDEPENDENT_AMBULATORY_CARE_PROVIDER_SITE_OTHER): Payer: Self-pay | Admitting: Family Medicine

## 2021-03-24 VITALS — BP 109/74 | HR 93 | Temp 98.2°F | Ht 64.0 in | Wt 177.0 lb

## 2021-03-24 DIAGNOSIS — Z6836 Body mass index (BMI) 36.0-36.9, adult: Secondary | ICD-10-CM

## 2021-03-24 DIAGNOSIS — Z794 Long term (current) use of insulin: Secondary | ICD-10-CM

## 2021-03-24 DIAGNOSIS — E1165 Type 2 diabetes mellitus with hyperglycemia: Secondary | ICD-10-CM

## 2021-03-24 DIAGNOSIS — F3289 Other specified depressive episodes: Secondary | ICD-10-CM | POA: Diagnosis not present

## 2021-03-24 MED ORDER — TIRZEPATIDE 2.5 MG/0.5ML ~~LOC~~ SOAJ: 2.5000 mg | SUBCUTANEOUS | 0 refills | Status: DC

## 2021-03-24 NOTE — Progress Notes (Signed)
Chief Complaint:   OBESITY Morgan Guerra is here to discuss her progress with her obesity treatment plan along with follow-up of her obesity related diagnoses. Morgan Guerra is on the Category 2 Plan and states she is following her eating plan approximately 50% of the time. Morgan Guerra states she is doing 0 minutes 0 times per week.  Today's visit was #: 9 Starting weight: 214 lbs Starting date: 09/18/2020 Today's weight: 177 lbs Today's date: 03/24/2021 Total lbs lost to date: 37 Total lbs lost since last in-office visit: 11  Interim History: Morgan Guerra's last visit was approximately 5 weeks ago. She had some challenges with death of her brother and then traveling for vacation. She is working on increasing her walking now that the weather is getting better. She mostly portion controls and makes smarter choices.  Subjective:   1. Type 2 diabetes mellitus with hyperglycemia, with long-term current use of insulin (HCC) Morgan Guerra is doing well with diet and weight loss. She notes decreased polyphagia. She did not bring her BGs log today, but she denies hypoglycemia.  2. Other depression with emotional eating Morgan Guerra had been on Topamax but she stopped as she felt she no longer needed it.  Assessment/Plan:   1. Type 2 diabetes mellitus with hyperglycemia, with long-term current use of insulin (HCC) Morgan Guerra will continue her medications, and we will refill [name] for 1 month. Good blood sugar control is important to decrease the likelihood of diabetic complications such as nephropathy, neuropathy, limb loss, blindness, coronary artery disease, and death. Intensive lifestyle modification including diet, exercise and weight loss are the first line of treatment for diabetes.   - tirzepatide [facility]) 2.5 MG/0.5ML Pen; Inject 2.5 mg into the skin once a week.  Dispense: 2 mL; Refill: 0  2. Other depression with emotional eating Behavior modification techniques were discussed today to help Morgan Guerra deal with her  emotional/non-hunger eating behaviors. Morgan Guerra agreed to discontinue Topamax, and will continue to monitor. Orders and follow up as documented in patient record.   3. Obesity, current BMI 30.4 Morgan Guerra is currently in the action stage of change. As such, her goal is to continue with weight loss efforts. She has agreed to practicing portion control and making smarter food choices, such as increasing vegetables and decreasing simple carbohydrates.   Behavioral modification strategies: increasing water intake.  Morgan Guerra has agreed to follow-up with our clinic in 2 to 3 weeks. She was informed of the importance of frequent follow-up visits to maximize her success with intensive lifestyle modifications for her multiple health conditions.   Objective:   Blood pressure 109/74, pulse 93, temperature 98.2 F (36.8 C), height 5\' 4"  (1.626 m), weight 177 lb (80.3 kg), SpO2 98 %. Body mass index is 30.38 kg/m.  General: Cooperative, alert, well developed, in no acute distress. HEENT: Conjunctivae and lids unremarkable. Cardiovascular: Regular rhythm.  Lungs: Normal work of breathing. Neurologic: No focal deficits.   Lab Results  Component Value Date   CREATININE 0.7 02/19/2021   BUN 24 (A) 02/19/2021   NA 140 02/19/2021   K 4.3 02/19/2021   CL 101 02/19/2021   CO2 27 (A) 02/19/2021   Lab Results  Component Value Date   ALT 18 02/19/2021   AST 17 02/19/2021   ALKPHOS 62 02/19/2021   BILITOT 0.7 11/22/2009   Lab Results  Component Value Date   HGBA1C 6.6 02/19/2021   HGBA1C (H) 11/24/2009    7.4 (NOTE)  According to the ADA Clinical Practice Recommendations for 2011, when HbA1c is used as a screening test:   >=6.5%   Diagnostic of Diabetes Mellitus           (if abnormal result  is confirmed)  5.7-6.4%   Increased risk of developing Diabetes Mellitus  References:Diagnosis and Classification of Diabetes Mellitus,Diabetes  Care,2011,34(Suppl 1):S62-S69 and Standards of Medical Care in         Diabetes - 2011,Diabetes Care,2011,34  (Suppl 1):S11-S61.   No results found for: INSULIN  Lab Results  Component Value Date   CHOL 138 02/19/2021   HDL 51 02/19/2021   LDLCALC 61 02/19/2021   TRIG 154 02/19/2021   CHOLHDL 3.0 11/23/2009   No results found for: VD25OH Lab Results  Component Value Date   WBC 6.9 11/23/2009   HGB 15.5 (H) 05/19/2012   HCT 38.0 05/18/2011   MCV 92.6 11/23/2009   PLT 159 DELTA CHECK NOTED 11/23/2009   No results found for: IRON, TIBC, FERRITIN  Obesity Behavioral Intervention:   Approximately 15 minutes were spent on the discussion below.  ASK: We discussed the diagnosis of obesity with Morgan Guerra today and Morgan Guerra agreed to give [place] permission to discuss obesity behavioral modification therapy today.  ASSESS: Morgan Guerra has the diagnosis of obesity and her BMI today is 30.4. Morgan Guerra is in the action stage of change.   ADVISE: Morgan Guerra was educated on the multiple health risks of obesity as well as the benefit of weight loss to improve her health. She was advised of the need for long term treatment and the importance of lifestyle modifications to improve her current health and to decrease her risk of future health problems.  AGREE: Multiple dietary modification options and treatment options were discussed and Morgan Guerra agreed to follow the recommendations documented in the above note.  ARRANGE: Morgan Guerra was educated on the importance of frequent visits to treat obesity as outlined per CMS and USPSTF guidelines and agreed to schedule her next follow up appointment today.  Attestation Statements:   Reviewed by clinician on day of visit: allergies, medications, problem list, medical history, surgical history, family history, social history, and previous encounter notes.   I, Burt Knack, am acting as transcriptionist for Quillian Quince, MD.  I have reviewed the above documentation for accuracy  and completeness, and I agree with the above. -  Quillian Quince, MD

## 2021-03-30 ENCOUNTER — Encounter (INDEPENDENT_AMBULATORY_CARE_PROVIDER_SITE_OTHER): Payer: Self-pay | Admitting: Family Medicine

## 2021-03-30 ENCOUNTER — Other Ambulatory Visit (INDEPENDENT_AMBULATORY_CARE_PROVIDER_SITE_OTHER): Payer: Self-pay | Admitting: Family Medicine

## 2021-03-30 DIAGNOSIS — E1165 Type 2 diabetes mellitus with hyperglycemia: Secondary | ICD-10-CM

## 2021-03-30 DIAGNOSIS — Z794 Long term (current) use of insulin: Secondary | ICD-10-CM

## 2021-03-30 NOTE — Telephone Encounter (Signed)
Pt last seen by Dr. Beasley.  

## 2021-03-31 NOTE — Telephone Encounter (Signed)
Message sent to pt. Via Mychart.

## 2021-04-13 ENCOUNTER — Other Ambulatory Visit: Payer: Self-pay

## 2021-04-13 ENCOUNTER — Encounter (INDEPENDENT_AMBULATORY_CARE_PROVIDER_SITE_OTHER): Payer: Self-pay | Admitting: Family Medicine

## 2021-04-13 ENCOUNTER — Ambulatory Visit (INDEPENDENT_AMBULATORY_CARE_PROVIDER_SITE_OTHER): Payer: 59 | Admitting: Family Medicine

## 2021-04-13 VITALS — BP 90/61 | HR 100 | Temp 98.4°F | Ht 64.0 in | Wt 173.0 lb

## 2021-04-13 DIAGNOSIS — E1165 Type 2 diabetes mellitus with hyperglycemia: Secondary | ICD-10-CM | POA: Diagnosis not present

## 2021-04-13 DIAGNOSIS — Z794 Long term (current) use of insulin: Secondary | ICD-10-CM

## 2021-04-13 DIAGNOSIS — Z9189 Other specified personal risk factors, not elsewhere classified: Secondary | ICD-10-CM | POA: Diagnosis not present

## 2021-04-13 DIAGNOSIS — I10 Essential (primary) hypertension: Secondary | ICD-10-CM

## 2021-04-13 DIAGNOSIS — Z6836 Body mass index (BMI) 36.0-36.9, adult: Secondary | ICD-10-CM

## 2021-04-13 MED ORDER — TIRZEPATIDE 2.5 MG/0.5ML ~~LOC~~ SOAJ: 2.5000 mg | SUBCUTANEOUS | 0 refills | Status: DC

## 2021-04-14 NOTE — Progress Notes (Signed)
Chief Complaint:   OBESITY Morgan Guerra is here to discuss her progress with her obesity treatment plan along with follow-up of her obesity related diagnoses. [name] is on practicing portion control and making smarter food choices, such as increasing vegetables and decreasing simple carbohydrates and states she is following her eating plan approximately 80% of the time. [name] states she is doing 0 minutes 0 times per week.  Today's visit was #: 10 Starting weight: 214 lbs Starting date: 09/18/2020 Today's weight: 173 lbs Today's date: 04/13/2021 Total lbs lost to date: 41 Total lbs lost since last in-office visit: 4  Interim History: [name] continues to do well with weight loss. Her hunger is controlled and she is trying to decrease her simple carbohydrates. She will be retiring in a few weeks, and she feels she will have more time to meal plan and prep, and start pool exercising.  Subjective:   1. Type 2 diabetes mellitus with hyperglycemia, with long-term current use of insulin (HCC) Morgan Guerra continues to do well with weight loss. She has some mild diarrhea/constipation, but no nausea or GI upset.  2. Essential hypertension Morgan Guerra is on lisinopril-hydrochlorothiazide. Her blood pressure is low but she denies any symptoms of hypotension. She is getting renal protection from her ACE.   3. At risk for impaired metabolic function Morgan Guerra is at increased risk for impaired metabolic function if calories or protein decreases.  Assessment/Plan:   1. Type 2 diabetes mellitus with hyperglycemia, with long-term current use of insulin (HCC) Morgan Guerra will continue her medications, and we will refill [name] for 1 month. Good blood sugar control is important to decrease the likelihood of diabetic complications such as nephropathy, neuropathy, limb loss, blindness, coronary artery disease, and death. Intensive lifestyle modification including diet, exercise and weight loss are the first line of treatment for  diabetes.   - tirzepatide [facility]) 2.5 MG/0.5ML Pen; Inject 2.5 mg into the skin once a week.  Dispense: 2 mL; Refill: 0  2. Essential hypertension Morgan Guerra will continue her her medications, and will watch of low blood pressure signs. We may need to decrease lisinopril-hydrochlorothiazide first if her symptoms start.  3. At risk for impaired metabolic function [name] was given approximately 15 minutes of impaired  metabolic function prevention counseling today. We discussed intensive lifestyle modifications today with an emphasis on specific nutrition and exercise instructions and strategies.   Repetitive spaced learning was employed today to elicit superior memory formation and behavioral change.  4. Obesity, current BMI 29.8 Morgan Guerra is currently in the action stage of change. As such, her goal is to continue with weight loss efforts. She has agreed to following a lower carbohydrate, vegetable and lean protein rich diet plan.   Exercise goals: All adults should avoid inactivity. Some physical activity is better than none, and adults who participate in any amount of physical activity gain some health benefits.  Behavioral modification strategies: increasing lean protein intake, decreasing simple carbohydrates, and meal planning and cooking strategies.  Morgan Guerra has agreed to follow-up with our clinic in 3 weeks. She was informed of the importance of frequent follow-up visits to maximize her success with intensive lifestyle modifications for her multiple health conditions.   Objective:   Blood pressure 90/61, pulse 100, temperature 98.4 F (36.9 C), height 5\' 4"  (1.626 m), weight 173 lb (78.5 kg), SpO2 99 %. Body mass index is 29.7 kg/m.  General: Cooperative, alert, well developed, in no acute distress. HEENT: Conjunctivae and lids unremarkable. Cardiovascular: Regular rhythm.  Lungs:  Normal work of breathing. Neurologic: No focal deficits.   Lab Results  Component Value Date    CREATININE 0.7 02/19/2021   BUN 24 (A) 02/19/2021   NA 140 02/19/2021   K 4.3 02/19/2021   CL 101 02/19/2021   CO2 27 (A) 02/19/2021   Lab Results  Component Value Date   ALT 18 02/19/2021   AST 17 02/19/2021   ALKPHOS 62 02/19/2021   BILITOT 0.7 11/22/2009   Lab Results  Component Value Date   HGBA1C 6.6 02/19/2021   HGBA1C (H) 11/24/2009    7.4 (NOTE)                                                                       According to the ADA Clinical Practice Recommendations for 2011, when HbA1c is used as a screening test:   >=6.5%   Diagnostic of Diabetes Mellitus           (if abnormal result  is confirmed)  5.7-6.4%   Increased risk of developing Diabetes Mellitus  References:Diagnosis and Classification of Diabetes Mellitus,Diabetes Care,2011,34(Suppl 1):S62-S69 and Standards of Medical Care in         Diabetes - 2011,Diabetes Care,2011,34  (Suppl 1):S11-S61.   No results found for: INSULIN  Lab Results  Component Value Date   CHOL 138 02/19/2021   HDL 51 02/19/2021   LDLCALC 61 02/19/2021   TRIG 154 02/19/2021   CHOLHDL 3.0 11/23/2009   No results found for: VD25OH Lab Results  Component Value Date   WBC 6.9 11/23/2009   HGB 15.5 (H) 05/19/2012   HCT 38.0 05/18/2011   MCV 92.6 11/23/2009   PLT 159 DELTA CHECK NOTED 11/23/2009   No results found for: IRON, TIBC, FERRITIN  Attestation Statements:   Reviewed by clinician on day of visit: allergies, medications, problem list, medical history, surgical history, family history, social history, and previous encounter notes.   I, Burt Knack, am acting as transcriptionist for Quillian Quince, MD.  I have reviewed the above documentation for accuracy and completeness, and I agree with the above. -  Quillian Quince, MD

## 2021-05-11 ENCOUNTER — Encounter (INDEPENDENT_AMBULATORY_CARE_PROVIDER_SITE_OTHER): Payer: Self-pay

## 2021-05-12 ENCOUNTER — Encounter (INDEPENDENT_AMBULATORY_CARE_PROVIDER_SITE_OTHER): Payer: Self-pay | Admitting: Bariatrics

## 2021-05-12 ENCOUNTER — Other Ambulatory Visit: Payer: Self-pay

## 2021-05-12 ENCOUNTER — Ambulatory Visit (INDEPENDENT_AMBULATORY_CARE_PROVIDER_SITE_OTHER): Payer: 59 | Admitting: Bariatrics

## 2021-05-12 VITALS — BP 101/65 | HR 80 | Temp 97.9°F | Ht 64.0 in | Wt 173.0 lb

## 2021-05-12 DIAGNOSIS — I10 Essential (primary) hypertension: Secondary | ICD-10-CM

## 2021-05-12 DIAGNOSIS — E1165 Type 2 diabetes mellitus with hyperglycemia: Secondary | ICD-10-CM

## 2021-05-12 DIAGNOSIS — Z6836 Body mass index (BMI) 36.0-36.9, adult: Secondary | ICD-10-CM

## 2021-05-12 MED ORDER — TIRZEPATIDE 5 MG/0.5ML ~~LOC~~ SOAJ: 5.0000 mg | SUBCUTANEOUS | 0 refills | Status: DC

## 2021-05-12 NOTE — Progress Notes (Addendum)
Chief Complaint:   OBESITY Morgan Guerra is here to discuss her progress with her obesity treatment plan along with follow-up of her obesity related diagnoses. Morgan Guerra is on the Category 2 Plan and states she is following her eating plan approximately 50% of the time. Morgan Guerra states she is doing 0 minutes 0 times per week.  Today's visit was #: 11 Starting weight: 214 lbs Starting date: 09/18/2020 Today's weight: 173 lbs Today's date: 05/12/2021 Total lbs lost to date: 41 lbs Total lbs lost since last in-office visit: 0  Interim History: Morgan Guerra has maintained her weight and has done well overall.  Subjective:   1. Type 2 diabetes mellitus with hyperglycemia, with long-term current use of insulin (HCC) Morgan Guerra is taking [name] currently. She denies side effects.  2. Essential hypertension Morgan Guerra's blood pressure is well controlled.  Assessment/Plan:   1. Type 2 diabetes mellitus with hyperglycemia, with long-term current use of insulin (HCC) We will refill [name] 5.0 mg with no refills. Good blood sugar control is important to decrease the likelihood of diabetic complications such as nephropathy, neuropathy, limb loss, blindness, coronary artery disease, and death. Intensive lifestyle modification including diet, exercise and weight loss are the first line of treatment for diabetes.   - tirzepatide [facility]) 5 MG/0.5ML Pen; Inject 5 mg into the skin once a week.  Dispense: 2 mL; Refill: 0  2. Essential hypertension Morgan Guerra will continue her medications. She is working on healthy weight loss and exercise to improve blood pressure control. We will watch for signs of hypotension as she continues her lifestyle modifications.  3. Obesity BMI today is 70 Morgan Guerra is currently in the action stage of change. As such, her goal is to continue with weight loss efforts. She has agreed to the Category 2 Plan.   Strategies planning for the holidays. Morgan Guerra will continue meal planning.  Exercise goals: No  exercise has been prescribed at this time.  Behavioral modification strategies: increasing lean protein intake, decreasing simple carbohydrates, increasing vegetables, increasing water intake, decreasing eating out, no skipping meals, meal planning and cooking strategies, keeping healthy foods in the home, and planning for success.  Morgan Guerra has agreed to follow-up with our clinic August 02, 2021. She was informed of the importance of frequent follow-up visits to maximize her success with intensive lifestyle modifications for her multiple health conditions.   Objective:   Blood pressure 101/65, pulse 80, temperature 97.9 F (36.6 C), height 5\' 4"  (1.626 m), weight 173 lb (78.5 kg), SpO2 99 %. Body mass index is 29.7 kg/m.  General: Cooperative, alert, well developed, in no acute distress. HEENT: Conjunctivae and lids unremarkable. Cardiovascular: Regular rhythm.  Lungs: Normal work of breathing. Neurologic: No focal deficits.   Lab Results  Component Value Date   CREATININE 0.7 02/19/2021   BUN 24 (A) 02/19/2021   NA 140 02/19/2021   K 4.3 02/19/2021   CL 101 02/19/2021   CO2 27 (A) 02/19/2021   Lab Results  Component Value Date   ALT 18 02/19/2021   AST 17 02/19/2021   ALKPHOS 62 02/19/2021   BILITOT 0.7 11/22/2009   Lab Results  Component Value Date   HGBA1C 6.6 02/19/2021   HGBA1C (H) 11/24/2009    7.4 (NOTE)  According to the ADA Clinical Practice Recommendations for 2011, when HbA1c is used as a screening test:   >=6.5%   Diagnostic of Diabetes Mellitus           (if abnormal result  is confirmed)  5.7-6.4%   Increased risk of developing Diabetes Mellitus  References:Diagnosis and Classification of Diabetes Mellitus,Diabetes Care,2011,34(Suppl 1):S62-S69 and Standards of Medical Care in         Diabetes - 2011,Diabetes Care,2011,34  (Suppl 1):S11-S61.   No results found for: INSULIN  Lab Results   Component Value Date   CHOL 138 02/19/2021   HDL 51 02/19/2021   LDLCALC 61 02/19/2021   TRIG 154 02/19/2021   CHOLHDL 3.0 11/23/2009   No results found for: VD25OH Lab Results  Component Value Date   WBC 6.9 11/23/2009   HGB 15.5 (H) 05/19/2012   HCT 38.0 05/18/2011   MCV 92.6 11/23/2009   PLT 159 DELTA CHECK NOTED 11/23/2009   No results found for: IRON, TIBC, FERRITIN  Attestation Statements:   Reviewed by clinician on day of visit: allergies, medications, problem list, medical history, surgical history, family history, social history, and previous encounter notes.  I, Jackson Latino, RMA, am acting as Energy manager for [employer], DO.   I have reviewed the above documentation for accuracy and completeness, and I agree with the above. Corinna Capra, DO

## 2021-06-02 ENCOUNTER — Other Ambulatory Visit: Payer: Self-pay

## 2021-06-02 ENCOUNTER — Encounter (INDEPENDENT_AMBULATORY_CARE_PROVIDER_SITE_OTHER): Payer: Self-pay | Admitting: Family Medicine

## 2021-06-02 ENCOUNTER — Ambulatory Visit (INDEPENDENT_AMBULATORY_CARE_PROVIDER_SITE_OTHER): Payer: 59 | Admitting: Family Medicine

## 2021-06-02 VITALS — BP 118/74 | HR 79 | Temp 98.2°F | Ht 64.0 in | Wt 173.0 lb

## 2021-06-02 DIAGNOSIS — Z794 Long term (current) use of insulin: Secondary | ICD-10-CM

## 2021-06-02 DIAGNOSIS — E1165 Type 2 diabetes mellitus with hyperglycemia: Secondary | ICD-10-CM | POA: Diagnosis not present

## 2021-06-02 DIAGNOSIS — Z6836 Body mass index (BMI) 36.0-36.9, adult: Secondary | ICD-10-CM

## 2021-06-02 MED ORDER — TIRZEPATIDE 5 MG/0.5ML ~~LOC~~ SOAJ: 5.0000 mg | SUBCUTANEOUS | 0 refills | Status: DC

## 2021-06-02 NOTE — Progress Notes (Signed)
Chief Complaint:   OBESITY Media is here to discuss her progress with her obesity treatment plan along with follow-up of her obesity related diagnoses. Sissi is on the Category 2 Plan and states she is following her eating plan approximately 60% of the time. Yashica states she is doing 0 minutes 0 times per week.  Today's visit was #: 12 Starting weight: 214 lbs Starting date: 09/18/2020 Today's weight: 173 lbs Today's date: 06/02/2021 Total lbs lost to date: 41 Total lbs lost since last in-office visit: 0  Interim History: Orlinda has done well avoiding holiday weight gain. She is working on portion control and minimizing snacking. Her hunger is mostly controlled.   Subjective:   1. Type 2 diabetes mellitus with hyperglycemia, with long-term current use of insulin (HCC) Larinda is working on diet and exercise. She is on [facility] but she is skipping some doses to stretch the medicine. She is retiring tomorrow and she will start Medicare next year.  Assessment/Plan:   1. Type 2 diabetes mellitus with hyperglycemia, with long-term current use of insulin (HCC) We will refill [name] for 1 month, and will continue [place], Empagliflozin-linagliptin, and metformin. Good blood sugar control is important to decrease the likelihood of diabetic complications such as nephropathy, neuropathy, limb loss, blindness, coronary artery disease, and death. Intensive lifestyle modification including diet, exercise and weight loss are the first line of treatment for diabetes.   - tirzepatide [facility]) 5 MG/0.5ML Pen; Inject 5 mg into the skin once a week.  Dispense: 2 mL; Refill: 0  2. Obesity BMI today is 44 Nithila is currently in the action stage of change. As such, her goal is to continue with weight loss efforts. She has agreed to the Category 2 Plan.   Behavioral modification strategies: meal planning and cooking strategies and holiday eating strategies .  Kemberly has agreed to follow-up with our  clinic in 5 weeks. She was informed of the importance of frequent follow-up visits to maximize her success with intensive lifestyle modifications for her multiple health conditions.   Objective:   Blood pressure 118/74, pulse 79, temperature 98.2 F (36.8 C), height 5\' 4"  (1.626 m), weight 173 lb (78.5 kg), SpO2 100 %. Body mass index is 29.7 kg/m.  General: Cooperative, alert, well developed, in no acute distress. HEENT: Conjunctivae and lids unremarkable. Cardiovascular: Regular rhythm.  Lungs: Normal work of breathing. Neurologic: No focal deficits.   Lab Results  Component Value Date   CREATININE 0.7 02/19/2021   BUN 24 (A) 02/19/2021   NA 140 02/19/2021   K 4.3 02/19/2021   CL 101 02/19/2021   CO2 27 (A) 02/19/2021   Lab Results  Component Value Date   ALT 18 02/19/2021   AST 17 02/19/2021   ALKPHOS 62 02/19/2021   BILITOT 0.7 11/22/2009   Lab Results  Component Value Date   HGBA1C 6.6 02/19/2021   HGBA1C (H) 11/24/2009    7.4 (NOTE)                                                                       According to the ADA Clinical Practice Recommendations for 2011, when HbA1c is used as a screening test:   >=6.5%   Diagnostic of Diabetes  Mellitus           (if abnormal result  is confirmed)  5.7-6.4%   Increased risk of developing Diabetes Mellitus  References:Diagnosis and Classification of Diabetes Mellitus,Diabetes Care,2011,34(Suppl 1):S62-S69 and Standards of Medical Care in         Diabetes - 2011,Diabetes Care,2011,34  (Suppl 1):S11-S61.   No results found for: INSULIN  Lab Results  Component Value Date   CHOL 138 02/19/2021   HDL 51 02/19/2021   LDLCALC 61 02/19/2021   TRIG 154 02/19/2021   CHOLHDL 3.0 11/23/2009   No results found for: VD25OH Lab Results  Component Value Date   WBC 6.9 11/23/2009   HGB 15.5 (H) 05/19/2012   HCT 38.0 05/18/2011   MCV 92.6 11/23/2009   PLT 159 DELTA CHECK NOTED 11/23/2009   No results found for: IRON, TIBC,  FERRITIN  Attestation Statements:   Reviewed by clinician on day of visit: allergies, medications, problem list, medical history, surgical history, family history, social history, and previous encounter notes.  Time spent on visit including pre-visit chart review and post-visit care and charting was 31 minutes.    I, Burt Knack, am acting as transcriptionist for Quillian Quince, MD.  I have reviewed the above documentation for accuracy and completeness, and I agree with the above. -  Quillian Quince, MD

## 2021-06-23 ENCOUNTER — Other Ambulatory Visit (INDEPENDENT_AMBULATORY_CARE_PROVIDER_SITE_OTHER): Payer: Self-pay | Admitting: Bariatrics

## 2021-06-23 DIAGNOSIS — Z794 Long term (current) use of insulin: Secondary | ICD-10-CM

## 2021-06-23 NOTE — Telephone Encounter (Signed)
LAST APPOINTMENT DATE: 06/02/21 NEXT APPOINTMENT DATE: 07/15/21   [id number] - [place], [place] - [address] AT [facility] OF [address] [place] [address] Phone: [phone] Fax: [phone]  [id number] - [place], [place] - [address] [place] [address] Phone: [phone] Fax: [phone]  [id number] - [place], [place] - [address] AT [facility] OF [address] & [address] [place] [address] Phone: [phone] Fax: [phone]  Patient is requesting a refill of the following medications: Requested Prescriptions   Pending Prescriptions Disp Refills   [name] 5 MG/0.5ML Pen [Pharmacy Med Name: [name] 5MG /0.5ML PF PEN INJ] 2 mL 0    Sig: INJECT 5 MG INTO THE SKIN ONCE WEEKLY.    Date last filled: 06/02/21 Previously prescribed by Dr. 06/04/21  Lab Results  Component Value Date   HGBA1C 6.6 02/19/2021   HGBA1C (H) 11/24/2009    7.4 (NOTE)                                                                       According to the ADA Clinical Practice Recommendations for 2011, when HbA1c is used as a screening test:   >=6.5%   Diagnostic of Diabetes Mellitus           (if abnormal result  is confirmed)  5.7-6.4%   Increased risk of developing Diabetes Mellitus  References:Diagnosis and Classification of Diabetes Mellitus,Diabetes Care,2011,34(Suppl 1):S62-S69 and Standards of Medical Care in         Diabetes - 2011,Diabetes Care,2011,34  (Suppl 1):S11-S61.   Lab Results  Component Value Date   MICROALBUR 1.34 02/19/2021   LDLCALC 61 02/19/2021   CREATININE 0.7 02/19/2021   No results found for: VD25OH  BP Readings from Last 3 Encounters:  06/02/21 118/74  05/12/21 101/65  04/13/21 90/61

## 2021-06-23 NOTE — Telephone Encounter (Signed)
Dr.Beasley 

## 2021-07-07 ENCOUNTER — Ambulatory Visit (INDEPENDENT_AMBULATORY_CARE_PROVIDER_SITE_OTHER): Payer: 59 | Admitting: Family Medicine

## 2021-07-12 IMAGING — DX DG CHEST 2V
2 series · 2 of 2 positions shown · non-contrast
Comparison: None.

CLINICAL DATA: Right-sided musculoskeletal chest pain along the
sternum/manubrium after feeling a pop.

EXAM:
CHEST - 2 VIEW

[chest pa]
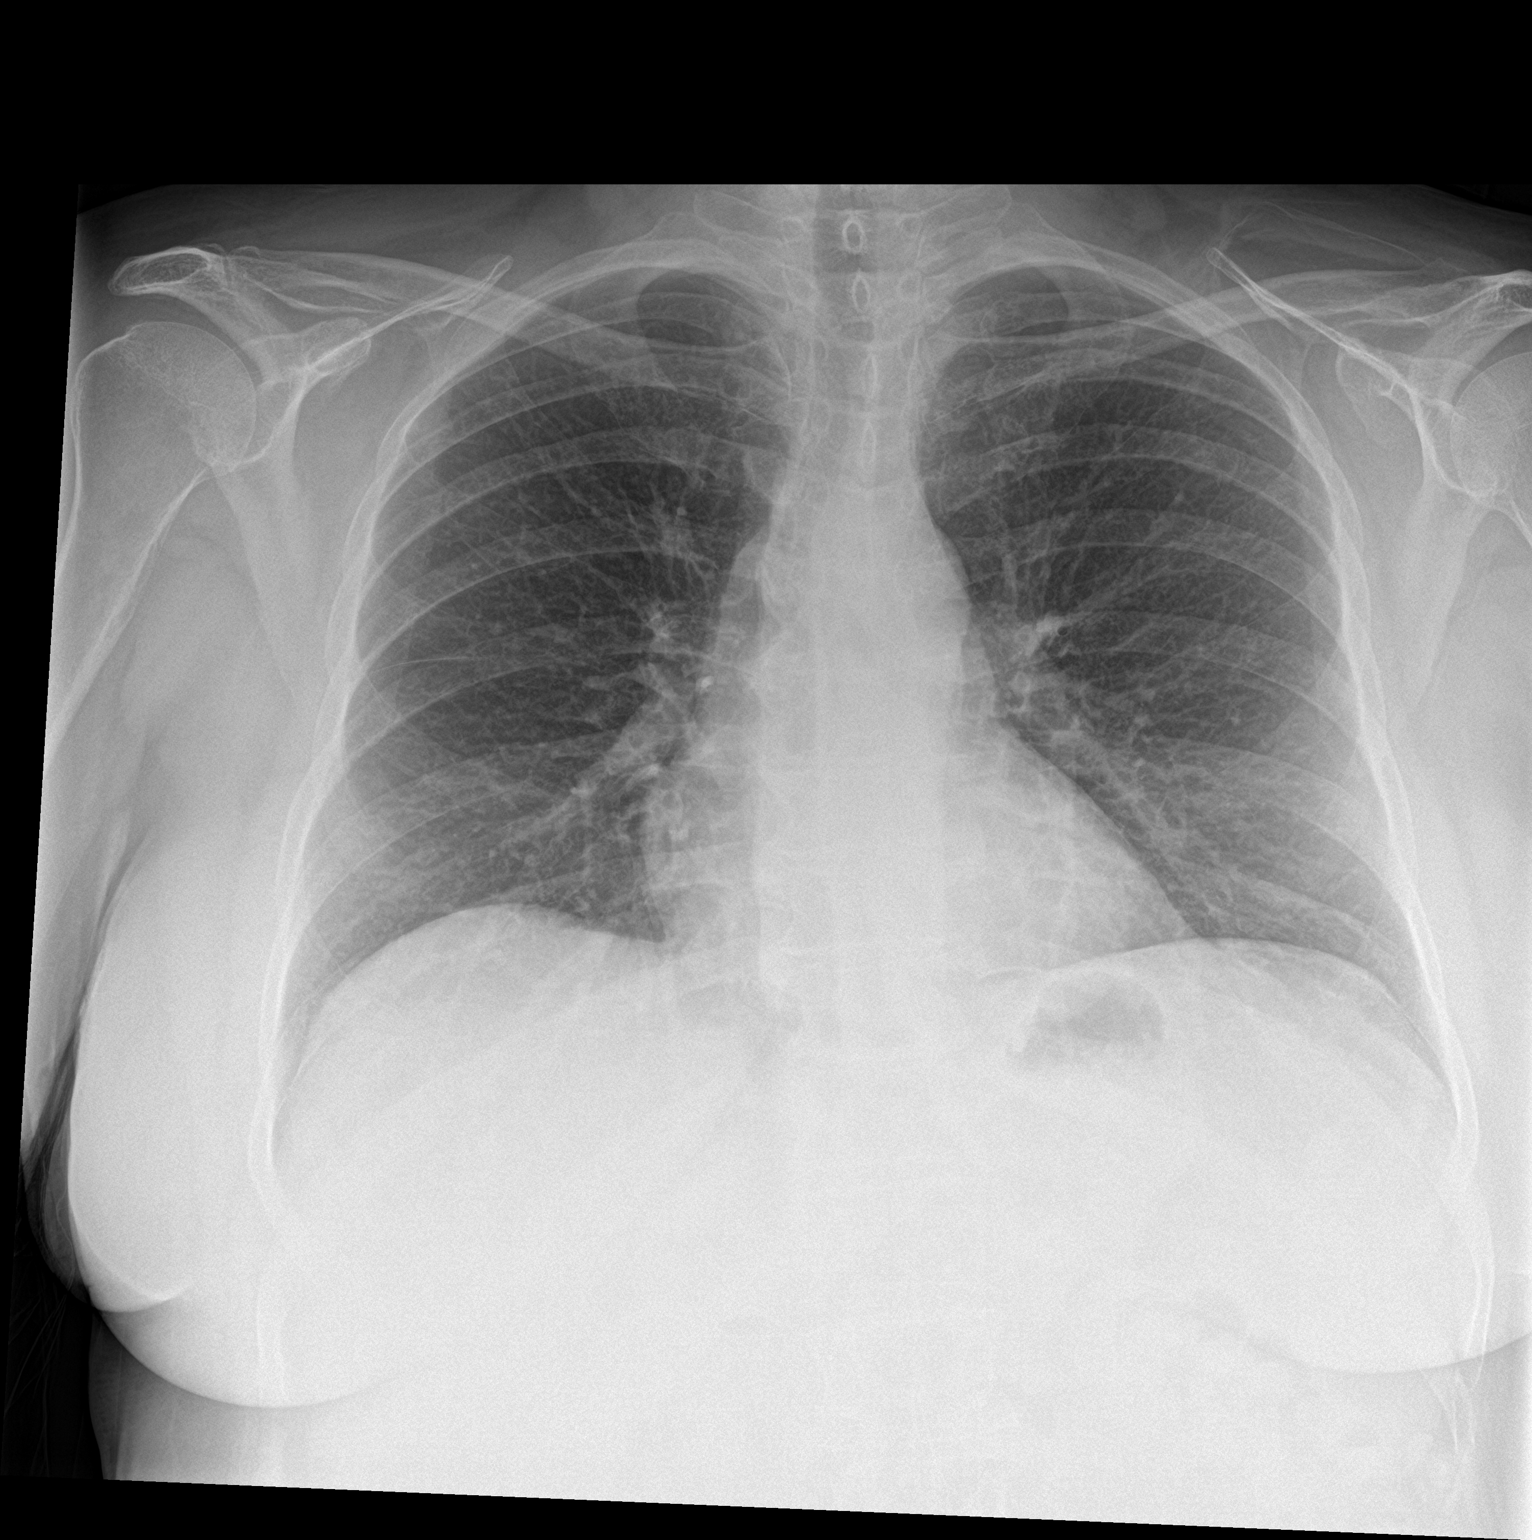

[chest lat]
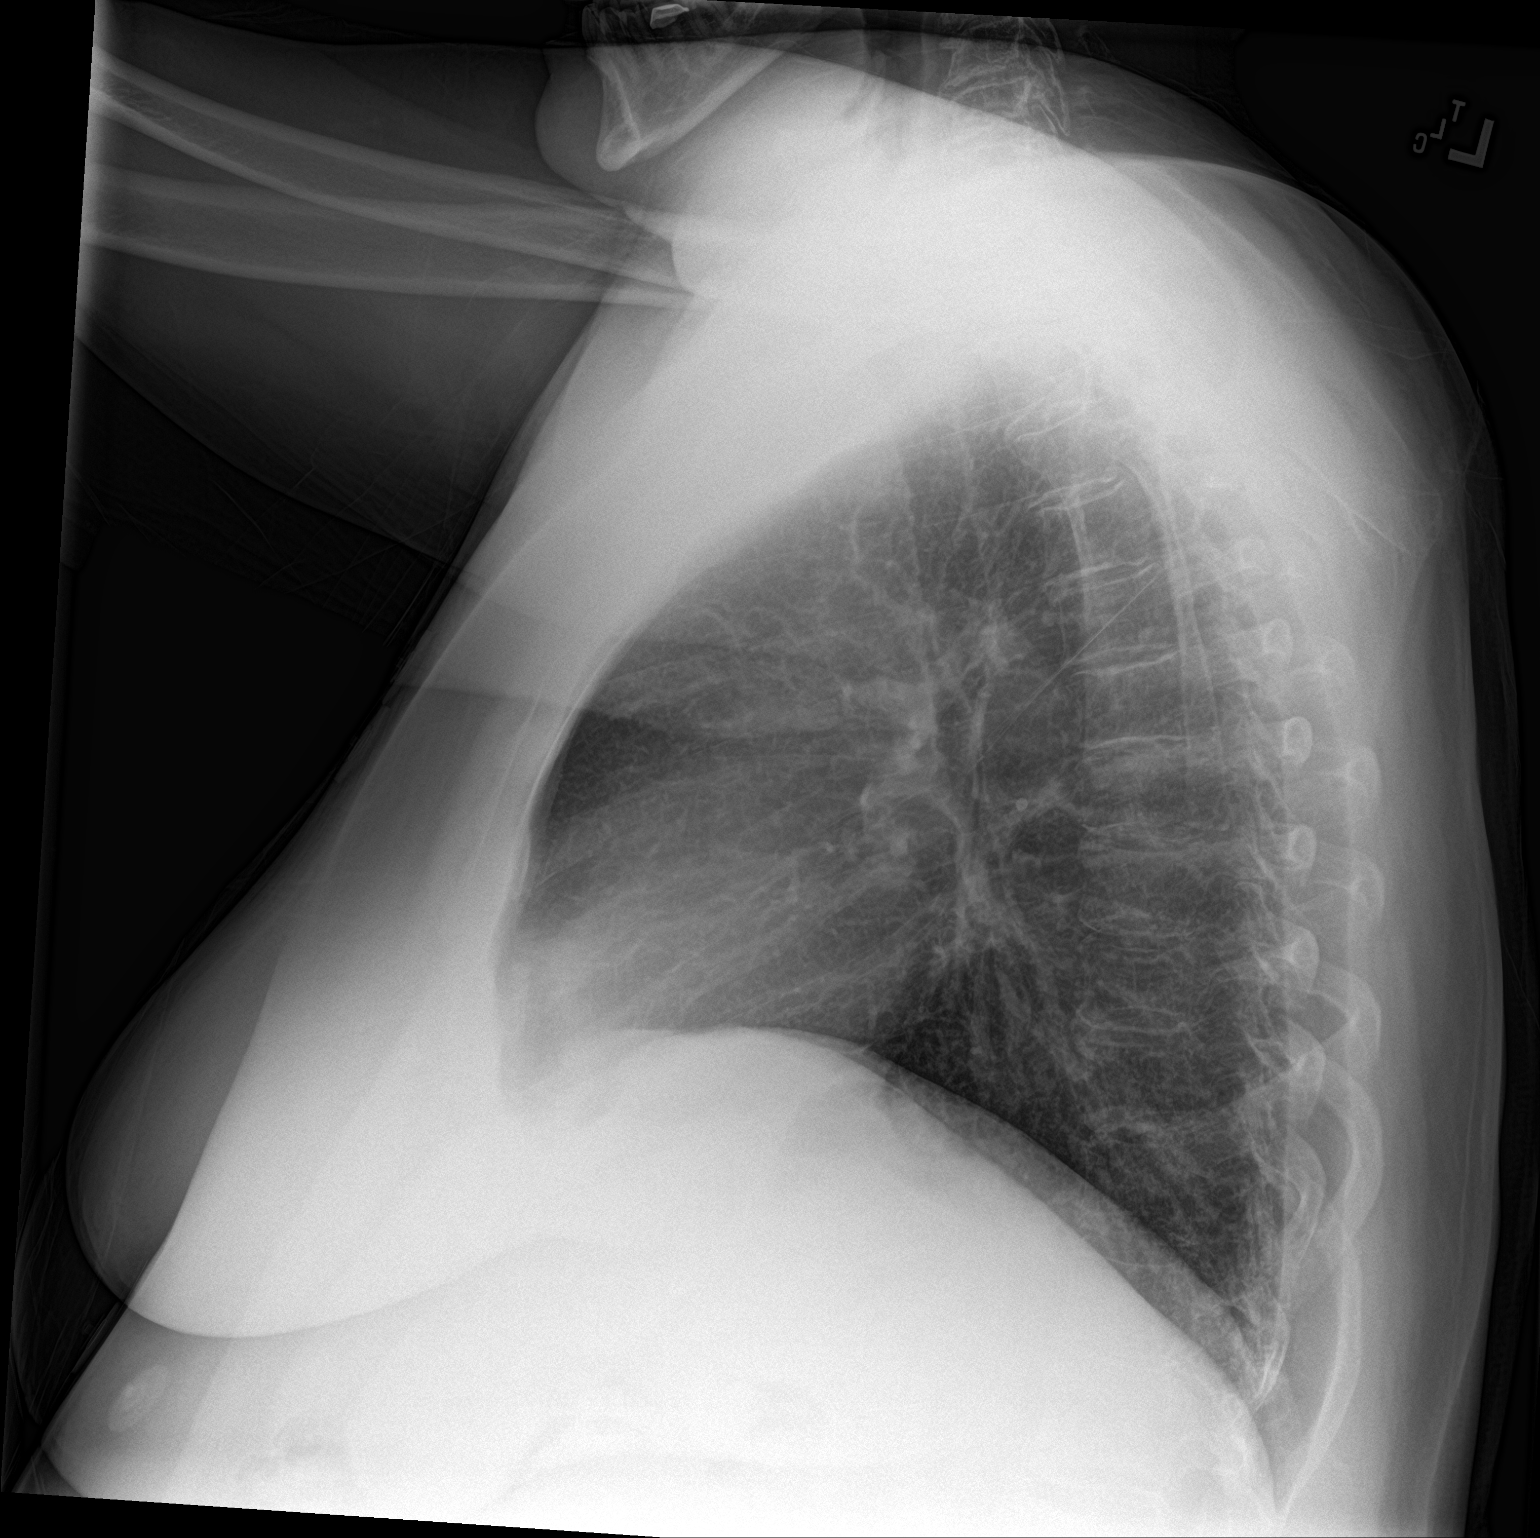

[2 of 2 positions shown; findings below may reference images not displayed]

FINDINGS: The heart size and mediastinal contours are within normal limits.
Both lungs are clear. The visualized skeletal structures are
unremarkable.
IMPRESSION: No active cardiopulmonary disease.

## 2021-07-15 ENCOUNTER — Ambulatory Visit (INDEPENDENT_AMBULATORY_CARE_PROVIDER_SITE_OTHER): Payer: 59 | Admitting: Physician Assistant

## 2021-08-06 ENCOUNTER — Ambulatory Visit (INDEPENDENT_AMBULATORY_CARE_PROVIDER_SITE_OTHER): Payer: HMO | Admitting: Family Medicine

## 2021-08-06 ENCOUNTER — Other Ambulatory Visit: Payer: Self-pay

## 2021-08-06 ENCOUNTER — Encounter (INDEPENDENT_AMBULATORY_CARE_PROVIDER_SITE_OTHER): Payer: Self-pay | Admitting: Family Medicine

## 2021-08-06 VITALS — BP 95/64 | HR 82 | Temp 97.9°F | Ht 64.0 in | Wt 171.0 lb

## 2021-08-06 DIAGNOSIS — E669 Obesity, unspecified: Secondary | ICD-10-CM | POA: Diagnosis not present

## 2021-08-06 DIAGNOSIS — Z794 Long term (current) use of insulin: Secondary | ICD-10-CM | POA: Diagnosis not present

## 2021-08-06 DIAGNOSIS — I152 Hypertension secondary to endocrine disorders: Secondary | ICD-10-CM | POA: Diagnosis not present

## 2021-08-06 DIAGNOSIS — E1159 Type 2 diabetes mellitus with other circulatory complications: Secondary | ICD-10-CM | POA: Diagnosis not present

## 2021-08-06 DIAGNOSIS — E785 Hyperlipidemia, unspecified: Secondary | ICD-10-CM | POA: Diagnosis not present

## 2021-08-06 DIAGNOSIS — R350 Frequency of micturition: Secondary | ICD-10-CM | POA: Diagnosis not present

## 2021-08-06 DIAGNOSIS — E1169 Type 2 diabetes mellitus with other specified complication: Secondary | ICD-10-CM | POA: Diagnosis not present

## 2021-08-06 DIAGNOSIS — Z6829 Body mass index (BMI) 29.0-29.9, adult: Secondary | ICD-10-CM | POA: Diagnosis not present

## 2021-08-06 DIAGNOSIS — E1165 Type 2 diabetes mellitus with hyperglycemia: Secondary | ICD-10-CM

## 2021-08-06 DIAGNOSIS — G4700 Insomnia, unspecified: Secondary | ICD-10-CM | POA: Diagnosis not present

## 2021-08-06 DIAGNOSIS — H9319 Tinnitus, unspecified ear: Secondary | ICD-10-CM | POA: Diagnosis not present

## 2021-08-10 NOTE — Progress Notes (Signed)
Chief Complaint:   OBESITY Morgan Guerra is here to discuss her progress with her obesity treatment plan along with follow-up of her obesity related diagnoses. See Medical Weight Management Flowsheet for complete bioelectrical impedance results.  Today's visit was #: 13 Starting weight: 214 lbs Starting date: 09/18/2020 Weight change since last visit: 171 lbs Total lbs lost to date: 2 lbs Total weight loss percentage to date: -20.09%  Nutrition Plan: Category 2 Plan for 50% of the time.  Activity: None.  Interim History: Morgan Guerra retired just before the holidays.  She can no longer afford [name].  She still has 5 doses of 5 mg left.  She says she plans to take it every 2 weeks.  She is working on Journalist, newspaper and exercise.  Assessment/Plan:   1. Type 2 diabetes mellitus with hyperglycemia, with long-term current use of insulin (HCC) Diabetes Mellitus: Not at goal. Medication: [name] 5 mg subcutaneously weekly, metformin 1000 mg twice daily, Tresiba 90 units daily. Issues reviewed: blood sugar goals, complications of diabetes mellitus, hypoglycemia prevention and treatment, exercise, and nutrition.  Plan: The patient will continue to focus on protein-rich, low simple carbohydrate foods. We reviewed the importance of hydration, regular exercise for stress reduction, and restorative sleep.   Lab Results  Component Value Date   HGBA1C 6.6 02/19/2021   HGBA1C (H) 11/24/2009    7.4 (NOTE)                                                                       According to the ADA Clinical Practice Recommendations for 2011, when HbA1c is used as a screening test:   >=6.5%   Diagnostic of Diabetes Mellitus           (if abnormal result  is confirmed)  5.7-6.4%   Increased risk of developing Diabetes Mellitus  References:Diagnosis and Classification of Diabetes Mellitus,Diabetes Care,2011,34(Suppl 1):S62-S69 and Standards of Medical Care in         Diabetes - 2011,Diabetes Care,2011,34  (Suppl  1):S11-S61.   Lab Results  Component Value Date   MICROALBUR 1.34 02/19/2021   LDLCALC 61 02/19/2021   CREATININE 0.7 02/19/2021   2. Hypertension associated with diabetes At goal. Medications: Zestoretic 20-12.5 mg daily.   Plan: Avoid buying foods that are: processed, frozen, or prepackaged to avoid excess salt. We will watch for signs of hypotension as she continues lifestyle modifications.  BP Readings from Last 3 Encounters:  08/06/21 95/64  06/02/21 118/74  05/12/21 101/65   Lab Results  Component Value Date   CREATININE 0.7 02/19/2021   3. Hyperlipidemia associated with type 2 diabetes mellitus Course: Controlled. Lipid-lowering medications: Zocor 20 mg daily.   Plan: Dietary changes: Increase soluble fiber, decrease simple carbohydrates, decrease saturated fat. Exercise changes: Moderate to vigorous-intensity aerobic activity 150 minutes per week or as tolerated. We will continue to monitor along with PCP/specialists as it pertains to her weight loss journey.  Lab Results  Component Value Date   CHOL 138 02/19/2021   HDL 51 02/19/2021   LDLCALC 61 02/19/2021   TRIG 154 02/19/2021   CHOLHDL 3.0 11/23/2009   Lab Results  Component Value Date   ALT 18 02/19/2021   AST 17 02/19/2021   ALKPHOS 62 02/19/2021  BILITOT 0.7 11/22/2009   The 10-year ASCVD risk score (Arnett DK, et al., 2019) is: 6.7%*   Values used to calculate the score:     Age: 66 years     Sex: Female     Is Non-Hispanic African American: No     Diabetic: Yes     Tobacco smoker: No     Systolic Blood Pressure: 95 mmHg     Is BP treated: Yes     HDL Cholesterol: 51 mg/dL*     Total Cholesterol: 138 mg/dL*     * - Cholesterol units were assumed for this score calculation  4. Obesity BMI today is 29.4  Course: Morgan Guerra is currently in the action stage of change. As such, her goal is to continue with weight loss efforts.   Nutrition goals: She has agreed to the Category 2 Plan.   Exercise  goals: Older adults should follow the adult guidelines. When older adults cannot meet the adult guidelines, they should be as physically active as their abilities and conditions will allow.  Older adults should do exercises that maintain or improve balance if they are at risk of falling.   Behavioral modification strategies: increasing lean protein intake, decreasing simple carbohydrates, and increasing vegetables.  Morgan Guerra has agreed to follow-up with our clinic in 3 weeks. She was informed of the importance of frequent follow-up visits to maximize her success with intensive lifestyle modifications for her multiple health conditions.   Objective:   Blood pressure 95/64, pulse 82, temperature 97.9 F (36.6 C), temperature source Oral, height 5\' 4"  (1.626 m), weight 171 lb (77.6 kg), SpO2 98 %. Body mass index is 29.35 kg/m.  General: Cooperative, alert, well developed, in no acute distress. HEENT: Conjunctivae and lids unremarkable. Cardiovascular: Regular rhythm.  Lungs: Normal work of breathing. Neurologic: No focal deficits.   Lab Results  Component Value Date   CREATININE 0.7 02/19/2021   BUN 24 (A) 02/19/2021   NA 140 02/19/2021   K 4.3 02/19/2021   CL 101 02/19/2021   CO2 27 (A) 02/19/2021   Lab Results  Component Value Date   ALT 18 02/19/2021   AST 17 02/19/2021   ALKPHOS 62 02/19/2021   BILITOT 0.7 11/22/2009   Lab Results  Component Value Date   HGBA1C 6.6 02/19/2021   HGBA1C (H) 11/24/2009    7.4 (NOTE)                                                                       According to the ADA Clinical Practice Recommendations for 2011, when HbA1c is used as a screening test:   >=6.5%   Diagnostic of Diabetes Mellitus           (if abnormal result  is confirmed)  5.7-6.4%   Increased risk of developing Diabetes Mellitus  References:Diagnosis and Classification of Diabetes Mellitus,Diabetes Care,2011,34(Suppl 1):S62-S69 and Standards of Medical Care in         Diabetes  - 2011,Diabetes Care,2011,34  (Suppl 1):S11-S61.    Lab Results  Component Value Date   CHOL 138 02/19/2021   HDL 51 02/19/2021   LDLCALC 61 02/19/2021   TRIG 154 02/19/2021   CHOLHDL 3.0 11/23/2009   Lab Results  Component Value Date  WBC 6.9 11/23/2009   HGB 15.5 (H) 05/19/2012   HCT 38.0 05/18/2011   MCV 92.6 11/23/2009   PLT 159 DELTA CHECK NOTED 11/23/2009   Attestation Statements:   Reviewed by clinician on day of visit: allergies, medications, problem list, medical history, surgical history, family history, social history, and previous encounter notes.  I, Insurance claims handler, CMA, am acting as transcriptionist for Helane Rima, DO  I have reviewed the above documentation for accuracy and completeness, and I agree with the above. -  Helane Rima, DO, MS, FAAFP, DABOM - Family and Bariatric Medicine.

## 2021-08-26 ENCOUNTER — Ambulatory Visit (INDEPENDENT_AMBULATORY_CARE_PROVIDER_SITE_OTHER): Payer: HMO | Admitting: Family Medicine

## 2021-08-31 ENCOUNTER — Encounter (INDEPENDENT_AMBULATORY_CARE_PROVIDER_SITE_OTHER): Payer: Self-pay | Admitting: Physician Assistant

## 2021-08-31 ENCOUNTER — Ambulatory Visit (INDEPENDENT_AMBULATORY_CARE_PROVIDER_SITE_OTHER): Payer: HMO | Admitting: Physician Assistant

## 2021-08-31 ENCOUNTER — Other Ambulatory Visit: Payer: Self-pay

## 2021-08-31 VITALS — BP 96/62 | HR 92 | Temp 97.9°F | Ht 64.0 in | Wt 171.0 lb

## 2021-08-31 DIAGNOSIS — Z6836 Body mass index (BMI) 36.0-36.9, adult: Secondary | ICD-10-CM

## 2021-08-31 DIAGNOSIS — E1165 Type 2 diabetes mellitus with hyperglycemia: Secondary | ICD-10-CM | POA: Diagnosis not present

## 2021-08-31 DIAGNOSIS — Z6829 Body mass index (BMI) 29.0-29.9, adult: Secondary | ICD-10-CM | POA: Diagnosis not present

## 2021-08-31 DIAGNOSIS — Z794 Long term (current) use of insulin: Secondary | ICD-10-CM | POA: Diagnosis not present

## 2021-08-31 DIAGNOSIS — E669 Obesity, unspecified: Secondary | ICD-10-CM | POA: Diagnosis not present

## 2021-08-31 NOTE — Progress Notes (Signed)
Chief Complaint:   OBESITY Morgan Guerra is here to discuss her progress with her obesity treatment plan along with follow-up of her obesity related diagnoses. Morgan Guerra is on the Category 2 Plan and states she is following her eating plan approximately 75% of the time. Morgan Guerra states she is doing 0 minutes 0 times per week.  Today's visit was #: 14 Starting weight: 214 lbs Starting date: 09/18/2020 Today's weight: 171 lbs Today's date: 08/31/2021 Total lbs lost to date: 43 lbs Total lbs lost since last in-office visit: 0  Interim History: Morgan Guerra states that she can lose weight, but she is "just not trying". [place] She wants to start going to the [place] to work out and wants to work on meal planning. Chips in the house are a challenge.  Subjective:   1. Type 2 diabetes mellitus with hyperglycemia, with long-term current use of insulin (HCC) Morgan Guerra's last A1C was 6.6. She is on Tresiba, Metformin and [name]. She can't afford [name] and she is only taking it every 2 weeks to make it last longer.   Assessment/Plan:   1. Type 2 diabetes mellitus with hyperglycemia, with long-term current use of insulin (HCC) Morgan Guerra will continue taking her medications. Good blood sugar control is important to decrease the likelihood of diabetic complications such as nephropathy, neuropathy, limb loss, blindness, coronary artery disease, and death. Intensive lifestyle modification including diet, exercise and weight loss are the first line of treatment for diabetes.   2. Obesity BMI today is 29.4 Morgan Guerra is currently in the action stage of change. As such, her goal is to continue with weight loss efforts. She has agreed to the Category 2 Plan.   Exercise goals: No exercise has been prescribed at this time.  Behavioral modification strategies: meal planning and cooking strategies and better snacking choices.  Morgan Guerra has agreed to follow-up with our clinic in 3 weeks. She  was informed of the importance of frequent follow-up visits to maximize her success with intensive lifestyle modifications for her multiple health conditions.   Objective:   Blood pressure 96/62, pulse 92, temperature 97.9 F (36.6 C), height 5\' 4"  (1.626 m), weight 171 lb (77.6 kg), SpO2 99 %. Body mass index is 29.35 kg/m.  General: Cooperative, alert, well developed, in no acute distress. HEENT: Conjunctivae and lids unremarkable. Cardiovascular: Regular rhythm.  Lungs: Normal work of breathing. Neurologic: No focal deficits.   Lab Results  Component Value Date   CREATININE 0.7 02/19/2021   BUN 24 (A) 02/19/2021   NA 140 02/19/2021   K 4.3 02/19/2021   CL 101 02/19/2021   CO2 27 (A) 02/19/2021   Lab Results  Component Value Date   ALT 18 02/19/2021   AST 17 02/19/2021   ALKPHOS 62 02/19/2021   BILITOT 0.7 11/22/2009   Lab Results  Component Value Date   HGBA1C 6.6 02/19/2021   HGBA1C (H) 11/24/2009    7.4 (NOTE)                                                                       According to the ADA Clinical Practice Recommendations for 2011, when HbA1c is used as a screening test:   >=  6.5%   Diagnostic of Diabetes Mellitus           (if abnormal result  is confirmed)  5.7-6.4%   Increased risk of developing Diabetes Mellitus  References:Diagnosis and Classification of Diabetes Mellitus,Diabetes [id number] 1):S62-S69 and Standards of Medical Care in         Diabetes - 2011,Diabetes Care,2011,34  (Suppl 1):S11-S61.   No results found for: INSULIN  Lab Results  Component Value Date   CHOL 138 02/19/2021   HDL 51 02/19/2021   LDLCALC 61 02/19/2021   TRIG 154 02/19/2021   CHOLHDL 3.0 11/23/2009   No results found for: VD25OH Lab Results  Component Value Date   WBC 6.9 11/23/2009   HGB 15.5 (H) 05/19/2012   HCT 38.0 05/18/2011   MCV 92.6 11/23/2009   PLT 159 DELTA CHECK NOTED 11/23/2009   No results found for: IRON, TIBC, FERRITIN  Obesity  Behavioral Intervention:   Approximately 15 minutes were spent on the discussion below.  ASK: We discussed the diagnosis of obesity with Morgan Guerra today and Morgan Guerra agreed to give [place] permission to discuss obesity behavioral modification therapy today.  ASSESS: Morgan Guerra has the diagnosis of obesity and her BMI today is 29.5. Morgan Guerra is in the action stage of change.   ADVISE: Morgan Guerra was educated on the multiple health risks of obesity as well as the benefit of weight loss to improve her health. She was advised of the need for long term treatment and the importance of lifestyle modifications to improve her current health and to decrease her risk of future health problems.  AGREE: Multiple dietary modification options and treatment options were discussed and Morgan Guerra agreed to follow the recommendations documented in the above note.  ARRANGE: Morgan Guerra was educated on the importance of frequent visits to treat obesity as outlined per CMS and USPSTF guidelines and agreed to schedule her next follow up appointment today.  Attestation Statements:   Reviewed by clinician on day of visit: allergies, medications, problem list, medical history, surgical history, family history, social history, and previous encounter notes.  I, Tonye Pearson, am acting as Location manager for [employer], PA-C.  I have reviewed the above documentation for accuracy and completeness, and I agree with the above. Abby Potash, PA-C

## 2021-09-11 DIAGNOSIS — Z0000 Encounter for general adult medical examination without abnormal findings: Secondary | ICD-10-CM | POA: Diagnosis not present

## 2021-09-11 DIAGNOSIS — K589 Irritable bowel syndrome without diarrhea: Secondary | ICD-10-CM | POA: Diagnosis not present

## 2021-09-11 DIAGNOSIS — M8008XA Age-related osteoporosis with current pathological fracture, vertebra(e), initial encounter for fracture: Secondary | ICD-10-CM | POA: Diagnosis not present

## 2021-09-11 DIAGNOSIS — E1169 Type 2 diabetes mellitus with other specified complication: Secondary | ICD-10-CM | POA: Diagnosis not present

## 2021-09-11 DIAGNOSIS — G4700 Insomnia, unspecified: Secondary | ICD-10-CM | POA: Diagnosis not present

## 2021-09-11 DIAGNOSIS — Z1159 Encounter for screening for other viral diseases: Secondary | ICD-10-CM | POA: Diagnosis not present

## 2021-09-11 DIAGNOSIS — M5430 Sciatica, unspecified side: Secondary | ICD-10-CM | POA: Diagnosis not present

## 2021-09-11 DIAGNOSIS — I10 Essential (primary) hypertension: Secondary | ICD-10-CM | POA: Diagnosis not present

## 2021-09-11 DIAGNOSIS — E782 Mixed hyperlipidemia: Secondary | ICD-10-CM | POA: Diagnosis not present

## 2021-09-11 DIAGNOSIS — F411 Generalized anxiety disorder: Secondary | ICD-10-CM | POA: Diagnosis not present

## 2021-09-11 DIAGNOSIS — Z23 Encounter for immunization: Secondary | ICD-10-CM | POA: Diagnosis not present

## 2021-09-11 DIAGNOSIS — B351 Tinea unguium: Secondary | ICD-10-CM | POA: Diagnosis not present

## 2021-09-16 ENCOUNTER — Ambulatory Visit (INDEPENDENT_AMBULATORY_CARE_PROVIDER_SITE_OTHER): Payer: HMO | Admitting: Family Medicine

## 2021-10-06 ENCOUNTER — Ambulatory Visit (INDEPENDENT_AMBULATORY_CARE_PROVIDER_SITE_OTHER): Payer: HMO | Admitting: Family Medicine

## 2021-10-14 ENCOUNTER — Encounter (INDEPENDENT_AMBULATORY_CARE_PROVIDER_SITE_OTHER): Payer: Self-pay | Admitting: Nurse Practitioner

## 2021-10-14 ENCOUNTER — Ambulatory Visit (INDEPENDENT_AMBULATORY_CARE_PROVIDER_SITE_OTHER): Payer: HMO | Admitting: Nurse Practitioner

## 2021-10-14 VITALS — BP 95/61 | HR 86 | Temp 98.5°F | Ht 64.0 in | Wt 172.0 lb

## 2021-10-14 DIAGNOSIS — E1159 Type 2 diabetes mellitus with other circulatory complications: Secondary | ICD-10-CM | POA: Diagnosis not present

## 2021-10-14 DIAGNOSIS — E1165 Type 2 diabetes mellitus with hyperglycemia: Secondary | ICD-10-CM

## 2021-10-14 DIAGNOSIS — Z6829 Body mass index (BMI) 29.0-29.9, adult: Secondary | ICD-10-CM | POA: Diagnosis not present

## 2021-10-14 DIAGNOSIS — Z7985 Long-term (current) use of injectable non-insulin antidiabetic drugs: Secondary | ICD-10-CM | POA: Diagnosis not present

## 2021-10-14 DIAGNOSIS — I152 Hypertension secondary to endocrine disorders: Secondary | ICD-10-CM

## 2021-10-14 DIAGNOSIS — E669 Obesity, unspecified: Secondary | ICD-10-CM

## 2021-10-22 DIAGNOSIS — Z79899 Other long term (current) drug therapy: Secondary | ICD-10-CM | POA: Diagnosis not present

## 2021-10-22 NOTE — Progress Notes (Signed)
? ? ? ?Chief Complaint:  ? ?OBESITY ?Morgan Guerra is here to discuss her progress with her obesity treatment plan along with follow-up of her obesity related diagnoses. Morgan Guerra is on the Category 2 Plan and states she is following her eating plan approximately 70-75% of the time. 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 ? ?Today's visit was #:  15 ?Starting weight: 214 lbs ?Starting date: 09/18/2020 ?Today's weight: 172 lbs ?Today's date: 10/14/2021 ?Total lbs lost to date: 42 lbs ?Total lbs lost since last in-office visit: 0 ? ?Interim History: Morgan Guerra retired last November. Since then she has been going to [facility] once per month. She has been doing "a lot of yardwork and housework". She is drinking water, flavored water, decaffeinate tea and soda.  ? ?Subjective:  ? ?1. Type 2 diabetes mellitus with hyperglycemia, with long-term current use of insulin (HCC) ?Morgan Guerra is currently taking [name] 5 mg  every other week due to cost. She is not sure if she will able to continue to take it. Per Morgan Guerra's report her last A1C was 7.2. She is on angiotensin-converting enzyme (ACE) and statin currently.  ? ?2. Hypertension associated with diabetes ?Morgan Guerra is doing well with current medications. She denies chest pain, shortness of breath, and palpitations.  ? ?Assessment/Plan:  ? ?1. Type 2 diabetes mellitus with hyperglycemia, with long-term current use of insulin (HCC) ?We will discuss again at next visit. Morgan Guerra will consider Victoza or Ozempic based upon cost. Good blood sugar control is important to decrease the likelihood of diabetic complications such as nephropathy, neuropathy, limb loss, blindness, coronary artery disease, and death. Intensive lifestyle modification including diet, exercise and weight loss are the first line of treatment for diabetes.  ? ?2. Hypertension associated with diabetes ?Morgan Guerra will continue to follow up with her primary care physician. She will continue  medications as directed. She is working on healthy weight loss and exercise to improve blood pressure control. We will watch for signs of hypotension as she continues her lifestyle modifications. ? ?3. Obesity BMI today is 29.5 ?Morgan Guerra is currently in the action stage of change. As such, her goal is to continue with weight loss efforts. She has agreed to the Category 2 Plan.  ? ?Exercise goals:  Morgan Guerra plans to join the [employer] once her sister is able to join her in 1-2 months.  ? ?Behavioral modification strategies: increasing lean protein intake, increasing water intake, and no skipping meals. ? ?Morgan Guerra has agreed to follow-up with our clinic as needed. She was informed of the importance of frequent follow-up visits to maximize her success with intensive lifestyle modifications for her multiple health conditions.  ? ?Objective:  ? ?Blood pressure 95/61, pulse 86, temperature 98.5 ?F (36.9 ?C), height 5\' 4"  (1.626 m), weight 172 lb (78 kg), SpO2 99 %. ?Body mass index is 29.52 kg/m?. ? ?General: Cooperative, alert, well developed, in no acute distress. ?HEENT: Conjunctivae and lids unremarkable. ?Cardiovascular: Regular rhythm.  ?Lungs: Normal work of breathing. ?Neurologic: No focal deficits.  ? ?Lab Results  ?Component Value Date  ? CREATININE 0.7 02/19/2021  ? BUN 24 (A) 02/19/2021  ? NA 140 02/19/2021  ? K 4.3 02/19/2021  ? CL 101 02/19/2021  ? CO2 27 (A) 02/19/2021  ? ?Lab Results  ?Component Value Date  ? ALT 18 02/19/2021  ? AST 17 02/19/2021  ? ALKPHOS 62 02/19/2021  ? BILITOT 0.7 11/22/2009  ? ?Lab Results  ?Component Value Date  ?  HGBA1C 6.6 02/19/2021  ? HGBA1C (H) 11/24/2009  ?  7.4 ?(NOTE)                                                                       According to the ADA Clinical Practice Recommendations for 2011, when HbA1c is used as a screening test:   >=6.5%   Diagnostic of Diabetes Mellitus           (if abnormal result ? is confirmed)  5.7-6.4%   Increased risk of developing Diabetes  [place]ellitus  References:Diagnosis and Classification of Diabetes Mellitus,Diabetes Care,2011,34(Suppl 1):S62-S69 and Standards of Medical Care in         Diabetes - 2011,Diabetes NWGN,5621,30  ?(Suppl 1):S11-S61.  ? ?No results found for: INSULIN ? ?Lab Results  ?Component Value Date  ? CHOL 138 02/19/2021  ? HDL 51 02/19/2021  ? LDLCALC 61 02/19/2021  ? TRIG 154 02/19/2021  ? CHOLHDL 3.0 11/23/2009  ? ?No results found for: VD25OH ?Lab Results  ?Component Value Date  ? WBC 6.9 11/23/2009  ? HGB 15.5 (H) 05/19/2012  ? HCT 38.0 05/18/2011  ? MCV 92.6 11/23/2009  ? PLT 159 DELTA CHECK NOTED 11/23/2009  ? ?No results found for: IRON, TIBC, FERRITIN ? ?Attestation Statements:  ? ?Reviewed by clinician on day of visit: allergies, medications, problem list, medical history, surgical history, family history, social history, and previous encounter notes. ? ?I, Jackson Latino, RMA, am acting as Energy manager for Irene Limbo, FNP. ? ?I have reviewed the above documentation for accuracy and completeness, and I agree with the above. Irene Limbo, FNP  ?

## 2021-11-04 DIAGNOSIS — E663 Overweight: Secondary | ICD-10-CM | POA: Diagnosis not present

## 2021-11-04 DIAGNOSIS — I10 Essential (primary) hypertension: Secondary | ICD-10-CM | POA: Diagnosis not present

## 2021-11-04 DIAGNOSIS — E782 Mixed hyperlipidemia: Secondary | ICD-10-CM | POA: Diagnosis not present

## 2021-11-04 DIAGNOSIS — E118 Type 2 diabetes mellitus with unspecified complications: Secondary | ICD-10-CM | POA: Diagnosis not present

## 2021-12-21 DIAGNOSIS — I10 Essential (primary) hypertension: Secondary | ICD-10-CM | POA: Diagnosis not present

## 2021-12-21 DIAGNOSIS — E1169 Type 2 diabetes mellitus with other specified complication: Secondary | ICD-10-CM | POA: Diagnosis not present

## 2021-12-21 DIAGNOSIS — K760 Fatty (change of) liver, not elsewhere classified: Secondary | ICD-10-CM | POA: Diagnosis not present

## 2022-01-11 DIAGNOSIS — I10 Essential (primary) hypertension: Secondary | ICD-10-CM | POA: Diagnosis not present

## 2022-01-11 DIAGNOSIS — E663 Overweight: Secondary | ICD-10-CM | POA: Diagnosis not present

## 2022-01-11 DIAGNOSIS — M549 Dorsalgia, unspecified: Secondary | ICD-10-CM | POA: Diagnosis not present

## 2022-01-11 DIAGNOSIS — E118 Type 2 diabetes mellitus with unspecified complications: Secondary | ICD-10-CM | POA: Diagnosis not present

## 2022-01-11 DIAGNOSIS — K760 Fatty (change of) liver, not elsewhere classified: Secondary | ICD-10-CM | POA: Diagnosis not present

## 2022-02-01 DIAGNOSIS — I10 Essential (primary) hypertension: Secondary | ICD-10-CM | POA: Diagnosis not present

## 2022-02-01 DIAGNOSIS — E118 Type 2 diabetes mellitus with unspecified complications: Secondary | ICD-10-CM | POA: Diagnosis not present

## 2022-02-01 DIAGNOSIS — E663 Overweight: Secondary | ICD-10-CM | POA: Diagnosis not present

## 2022-02-01 DIAGNOSIS — M549 Dorsalgia, unspecified: Secondary | ICD-10-CM | POA: Diagnosis not present

## 2022-02-01 DIAGNOSIS — K760 Fatty (change of) liver, not elsewhere classified: Secondary | ICD-10-CM | POA: Diagnosis not present

## 2022-02-10 ENCOUNTER — Encounter (INDEPENDENT_AMBULATORY_CARE_PROVIDER_SITE_OTHER): Payer: Self-pay

## 2022-02-15 DIAGNOSIS — K760 Fatty (change of) liver, not elsewhere classified: Secondary | ICD-10-CM | POA: Diagnosis not present

## 2022-02-15 DIAGNOSIS — I10 Essential (primary) hypertension: Secondary | ICD-10-CM | POA: Diagnosis not present

## 2022-02-15 DIAGNOSIS — E663 Overweight: Secondary | ICD-10-CM | POA: Diagnosis not present

## 2022-02-15 DIAGNOSIS — M549 Dorsalgia, unspecified: Secondary | ICD-10-CM | POA: Diagnosis not present

## 2022-02-15 DIAGNOSIS — E782 Mixed hyperlipidemia: Secondary | ICD-10-CM | POA: Diagnosis not present

## 2022-02-15 DIAGNOSIS — E1169 Type 2 diabetes mellitus with other specified complication: Secondary | ICD-10-CM | POA: Diagnosis not present

## 2022-03-09 DIAGNOSIS — Z794 Long term (current) use of insulin: Secondary | ICD-10-CM | POA: Diagnosis not present

## 2022-03-09 DIAGNOSIS — E1169 Type 2 diabetes mellitus with other specified complication: Secondary | ICD-10-CM | POA: Diagnosis not present

## 2022-03-09 DIAGNOSIS — E782 Mixed hyperlipidemia: Secondary | ICD-10-CM | POA: Diagnosis not present

## 2022-03-09 DIAGNOSIS — K760 Fatty (change of) liver, not elsewhere classified: Secondary | ICD-10-CM | POA: Diagnosis not present

## 2022-03-09 DIAGNOSIS — I10 Essential (primary) hypertension: Secondary | ICD-10-CM | POA: Diagnosis not present

## 2022-03-09 DIAGNOSIS — Z7985 Long-term (current) use of injectable non-insulin antidiabetic drugs: Secondary | ICD-10-CM | POA: Diagnosis not present

## 2022-03-09 DIAGNOSIS — Z7984 Long term (current) use of oral hypoglycemic drugs: Secondary | ICD-10-CM | POA: Diagnosis not present

## 2022-03-16 DIAGNOSIS — M5416 Radiculopathy, lumbar region: Secondary | ICD-10-CM | POA: Diagnosis not present

## 2022-03-30 DIAGNOSIS — E782 Mixed hyperlipidemia: Secondary | ICD-10-CM | POA: Diagnosis not present

## 2022-03-30 DIAGNOSIS — Z23 Encounter for immunization: Secondary | ICD-10-CM | POA: Diagnosis not present

## 2022-03-30 DIAGNOSIS — E1169 Type 2 diabetes mellitus with other specified complication: Secondary | ICD-10-CM | POA: Diagnosis not present

## 2022-03-30 DIAGNOSIS — I10 Essential (primary) hypertension: Secondary | ICD-10-CM | POA: Diagnosis not present

## 2022-04-01 DIAGNOSIS — M5136 Other intervertebral disc degeneration, lumbar region: Secondary | ICD-10-CM | POA: Diagnosis not present

## 2022-04-01 DIAGNOSIS — M5416 Radiculopathy, lumbar region: Secondary | ICD-10-CM | POA: Diagnosis not present

## 2022-04-06 DIAGNOSIS — E1169 Type 2 diabetes mellitus with other specified complication: Secondary | ICD-10-CM | POA: Diagnosis not present

## 2022-04-06 DIAGNOSIS — E663 Overweight: Secondary | ICD-10-CM | POA: Diagnosis not present

## 2022-04-06 DIAGNOSIS — I10 Essential (primary) hypertension: Secondary | ICD-10-CM | POA: Diagnosis not present

## 2022-04-06 DIAGNOSIS — E782 Mixed hyperlipidemia: Secondary | ICD-10-CM | POA: Diagnosis not present

## 2022-04-06 DIAGNOSIS — K760 Fatty (change of) liver, not elsewhere classified: Secondary | ICD-10-CM | POA: Diagnosis not present

## 2022-07-13 DIAGNOSIS — E1169 Type 2 diabetes mellitus with other specified complication: Secondary | ICD-10-CM | POA: Diagnosis not present

## 2022-07-27 DIAGNOSIS — E1169 Type 2 diabetes mellitus with other specified complication: Secondary | ICD-10-CM | POA: Diagnosis not present

## 2022-07-27 DIAGNOSIS — E663 Overweight: Secondary | ICD-10-CM | POA: Diagnosis not present

## 2022-07-27 DIAGNOSIS — I10 Essential (primary) hypertension: Secondary | ICD-10-CM | POA: Diagnosis not present

## 2022-07-27 DIAGNOSIS — Z6828 Body mass index (BMI) 28.0-28.9, adult: Secondary | ICD-10-CM | POA: Diagnosis not present

## 2022-08-12 DIAGNOSIS — E1169 Type 2 diabetes mellitus with other specified complication: Secondary | ICD-10-CM | POA: Diagnosis not present

## 2022-08-17 DIAGNOSIS — E1169 Type 2 diabetes mellitus with other specified complication: Secondary | ICD-10-CM | POA: Diagnosis not present

## 2022-08-17 DIAGNOSIS — E663 Overweight: Secondary | ICD-10-CM | POA: Diagnosis not present

## 2022-08-17 DIAGNOSIS — Z6828 Body mass index (BMI) 28.0-28.9, adult: Secondary | ICD-10-CM | POA: Diagnosis not present

## 2022-08-17 DIAGNOSIS — Z8639 Personal history of other endocrine, nutritional and metabolic disease: Secondary | ICD-10-CM | POA: Diagnosis not present

## 2022-08-17 DIAGNOSIS — I152 Hypertension secondary to endocrine disorders: Secondary | ICD-10-CM | POA: Diagnosis not present

## 2022-08-17 DIAGNOSIS — E782 Mixed hyperlipidemia: Secondary | ICD-10-CM | POA: Diagnosis not present

## 2022-08-30 DIAGNOSIS — M8008XA Age-related osteoporosis with current pathological fracture, vertebra(e), initial encounter for fracture: Secondary | ICD-10-CM | POA: Diagnosis not present

## 2022-08-30 DIAGNOSIS — E782 Mixed hyperlipidemia: Secondary | ICD-10-CM | POA: Diagnosis not present

## 2022-08-30 DIAGNOSIS — G8929 Other chronic pain: Secondary | ICD-10-CM | POA: Diagnosis not present

## 2022-08-30 DIAGNOSIS — G4700 Insomnia, unspecified: Secondary | ICD-10-CM | POA: Diagnosis not present

## 2022-08-30 DIAGNOSIS — I10 Essential (primary) hypertension: Secondary | ICD-10-CM | POA: Diagnosis not present

## 2022-08-30 DIAGNOSIS — M8580 Other specified disorders of bone density and structure, unspecified site: Secondary | ICD-10-CM | POA: Diagnosis not present

## 2022-08-30 DIAGNOSIS — E1169 Type 2 diabetes mellitus with other specified complication: Secondary | ICD-10-CM | POA: Diagnosis not present

## 2022-09-03 DIAGNOSIS — R319 Hematuria, unspecified: Secondary | ICD-10-CM | POA: Diagnosis not present

## 2022-09-03 DIAGNOSIS — E119 Type 2 diabetes mellitus without complications: Secondary | ICD-10-CM | POA: Diagnosis not present

## 2022-09-03 DIAGNOSIS — I10 Essential (primary) hypertension: Secondary | ICD-10-CM | POA: Diagnosis not present

## 2022-09-03 DIAGNOSIS — R109 Unspecified abdominal pain: Secondary | ICD-10-CM | POA: Diagnosis not present

## 2022-09-07 DIAGNOSIS — E663 Overweight: Secondary | ICD-10-CM | POA: Diagnosis not present

## 2022-09-07 DIAGNOSIS — Z6828 Body mass index (BMI) 28.0-28.9, adult: Secondary | ICD-10-CM | POA: Diagnosis not present

## 2022-09-07 DIAGNOSIS — E1169 Type 2 diabetes mellitus with other specified complication: Secondary | ICD-10-CM | POA: Diagnosis not present

## 2022-09-11 DIAGNOSIS — E1169 Type 2 diabetes mellitus with other specified complication: Secondary | ICD-10-CM | POA: Diagnosis not present

## 2022-09-22 DIAGNOSIS — Z23 Encounter for immunization: Secondary | ICD-10-CM | POA: Diagnosis not present

## 2022-09-22 DIAGNOSIS — K589 Irritable bowel syndrome without diarrhea: Secondary | ICD-10-CM | POA: Diagnosis not present

## 2022-09-22 DIAGNOSIS — I10 Essential (primary) hypertension: Secondary | ICD-10-CM | POA: Diagnosis not present

## 2022-09-22 DIAGNOSIS — M8008XA Age-related osteoporosis with current pathological fracture, vertebra(e), initial encounter for fracture: Secondary | ICD-10-CM | POA: Diagnosis not present

## 2022-09-22 DIAGNOSIS — M5430 Sciatica, unspecified side: Secondary | ICD-10-CM | POA: Diagnosis not present

## 2022-09-22 DIAGNOSIS — F411 Generalized anxiety disorder: Secondary | ICD-10-CM | POA: Diagnosis not present

## 2022-09-22 DIAGNOSIS — E119 Type 2 diabetes mellitus without complications: Secondary | ICD-10-CM | POA: Diagnosis not present

## 2022-09-22 DIAGNOSIS — E1169 Type 2 diabetes mellitus with other specified complication: Secondary | ICD-10-CM | POA: Diagnosis not present

## 2022-09-22 DIAGNOSIS — E782 Mixed hyperlipidemia: Secondary | ICD-10-CM | POA: Diagnosis not present

## 2022-09-22 DIAGNOSIS — Z0000 Encounter for general adult medical examination without abnormal findings: Secondary | ICD-10-CM | POA: Diagnosis not present

## 2022-10-13 DIAGNOSIS — R632 Polyphagia: Secondary | ICD-10-CM | POA: Diagnosis not present

## 2022-10-13 DIAGNOSIS — Z6828 Body mass index (BMI) 28.0-28.9, adult: Secondary | ICD-10-CM | POA: Diagnosis not present

## 2022-10-13 DIAGNOSIS — E1169 Type 2 diabetes mellitus with other specified complication: Secondary | ICD-10-CM | POA: Diagnosis not present

## 2022-10-13 DIAGNOSIS — E663 Overweight: Secondary | ICD-10-CM | POA: Diagnosis not present

## 2022-10-13 DIAGNOSIS — I152 Hypertension secondary to endocrine disorders: Secondary | ICD-10-CM | POA: Diagnosis not present

## 2022-10-13 DIAGNOSIS — Z8639 Personal history of other endocrine, nutritional and metabolic disease: Secondary | ICD-10-CM | POA: Diagnosis not present

## 2022-11-01 DIAGNOSIS — E119 Type 2 diabetes mellitus without complications: Secondary | ICD-10-CM | POA: Diagnosis not present

## 2022-11-01 DIAGNOSIS — Z794 Long term (current) use of insulin: Secondary | ICD-10-CM | POA: Diagnosis not present

## 2022-11-01 DIAGNOSIS — M5430 Sciatica, unspecified side: Secondary | ICD-10-CM | POA: Diagnosis not present

## 2022-11-01 DIAGNOSIS — M8008XA Age-related osteoporosis with current pathological fracture, vertebra(e), initial encounter for fracture: Secondary | ICD-10-CM | POA: Diagnosis not present

## 2022-11-01 DIAGNOSIS — R0789 Other chest pain: Secondary | ICD-10-CM | POA: Diagnosis not present

## 2022-11-09 DIAGNOSIS — M5416 Radiculopathy, lumbar region: Secondary | ICD-10-CM | POA: Diagnosis not present

## 2022-11-10 DIAGNOSIS — Z6828 Body mass index (BMI) 28.0-28.9, adult: Secondary | ICD-10-CM | POA: Diagnosis not present

## 2022-11-10 DIAGNOSIS — Z8639 Personal history of other endocrine, nutritional and metabolic disease: Secondary | ICD-10-CM | POA: Diagnosis not present

## 2022-11-10 DIAGNOSIS — E1169 Type 2 diabetes mellitus with other specified complication: Secondary | ICD-10-CM | POA: Diagnosis not present

## 2022-11-10 DIAGNOSIS — E663 Overweight: Secondary | ICD-10-CM | POA: Diagnosis not present

## 2022-11-10 DIAGNOSIS — R632 Polyphagia: Secondary | ICD-10-CM | POA: Diagnosis not present

## 2022-11-10 DIAGNOSIS — I152 Hypertension secondary to endocrine disorders: Secondary | ICD-10-CM | POA: Diagnosis not present

## 2022-12-08 DIAGNOSIS — E1169 Type 2 diabetes mellitus with other specified complication: Secondary | ICD-10-CM | POA: Diagnosis not present

## 2022-12-08 DIAGNOSIS — Z8639 Personal history of other endocrine, nutritional and metabolic disease: Secondary | ICD-10-CM | POA: Diagnosis not present

## 2022-12-08 DIAGNOSIS — Z6828 Body mass index (BMI) 28.0-28.9, adult: Secondary | ICD-10-CM | POA: Diagnosis not present

## 2022-12-08 DIAGNOSIS — I10 Essential (primary) hypertension: Secondary | ICD-10-CM | POA: Diagnosis not present

## 2022-12-08 DIAGNOSIS — E663 Overweight: Secondary | ICD-10-CM | POA: Diagnosis not present

## 2022-12-08 DIAGNOSIS — E782 Mixed hyperlipidemia: Secondary | ICD-10-CM | POA: Diagnosis not present

## 2022-12-29 DIAGNOSIS — E1169 Type 2 diabetes mellitus with other specified complication: Secondary | ICD-10-CM | POA: Diagnosis not present

## 2022-12-29 DIAGNOSIS — E663 Overweight: Secondary | ICD-10-CM | POA: Diagnosis not present

## 2022-12-29 DIAGNOSIS — Z8639 Personal history of other endocrine, nutritional and metabolic disease: Secondary | ICD-10-CM | POA: Diagnosis not present

## 2022-12-29 DIAGNOSIS — E782 Mixed hyperlipidemia: Secondary | ICD-10-CM | POA: Diagnosis not present

## 2022-12-29 DIAGNOSIS — Z6828 Body mass index (BMI) 28.0-28.9, adult: Secondary | ICD-10-CM | POA: Diagnosis not present

## 2022-12-29 DIAGNOSIS — I10 Essential (primary) hypertension: Secondary | ICD-10-CM | POA: Diagnosis not present

## 2023-01-26 DIAGNOSIS — I152 Hypertension secondary to endocrine disorders: Secondary | ICD-10-CM | POA: Diagnosis not present

## 2023-01-26 DIAGNOSIS — R632 Polyphagia: Secondary | ICD-10-CM | POA: Diagnosis not present

## 2023-01-26 DIAGNOSIS — Z8639 Personal history of other endocrine, nutritional and metabolic disease: Secondary | ICD-10-CM | POA: Diagnosis not present

## 2023-01-26 DIAGNOSIS — E663 Overweight: Secondary | ICD-10-CM | POA: Diagnosis not present

## 2023-01-26 DIAGNOSIS — Z6828 Body mass index (BMI) 28.0-28.9, adult: Secondary | ICD-10-CM | POA: Diagnosis not present

## 2023-01-26 DIAGNOSIS — E1169 Type 2 diabetes mellitus with other specified complication: Secondary | ICD-10-CM | POA: Diagnosis not present

## 2023-02-16 DIAGNOSIS — E1169 Type 2 diabetes mellitus with other specified complication: Secondary | ICD-10-CM | POA: Diagnosis not present

## 2023-02-16 DIAGNOSIS — Z8639 Personal history of other endocrine, nutritional and metabolic disease: Secondary | ICD-10-CM | POA: Diagnosis not present

## 2023-02-16 DIAGNOSIS — Z6827 Body mass index (BMI) 27.0-27.9, adult: Secondary | ICD-10-CM | POA: Diagnosis not present

## 2023-02-16 DIAGNOSIS — R632 Polyphagia: Secondary | ICD-10-CM | POA: Diagnosis not present

## 2023-02-16 DIAGNOSIS — I152 Hypertension secondary to endocrine disorders: Secondary | ICD-10-CM | POA: Diagnosis not present

## 2023-02-16 DIAGNOSIS — E663 Overweight: Secondary | ICD-10-CM | POA: Diagnosis not present

## 2023-03-15 DIAGNOSIS — E663 Overweight: Secondary | ICD-10-CM | POA: Diagnosis not present

## 2023-03-15 DIAGNOSIS — Z8639 Personal history of other endocrine, nutritional and metabolic disease: Secondary | ICD-10-CM | POA: Diagnosis not present

## 2023-03-15 DIAGNOSIS — E782 Mixed hyperlipidemia: Secondary | ICD-10-CM | POA: Diagnosis not present

## 2023-03-15 DIAGNOSIS — Z6827 Body mass index (BMI) 27.0-27.9, adult: Secondary | ICD-10-CM | POA: Diagnosis not present

## 2023-03-15 DIAGNOSIS — I10 Essential (primary) hypertension: Secondary | ICD-10-CM | POA: Diagnosis not present

## 2023-03-15 DIAGNOSIS — E1169 Type 2 diabetes mellitus with other specified complication: Secondary | ICD-10-CM | POA: Diagnosis not present

## 2023-03-30 DIAGNOSIS — Z794 Long term (current) use of insulin: Secondary | ICD-10-CM | POA: Diagnosis not present

## 2023-03-30 DIAGNOSIS — M8008XA Age-related osteoporosis with current pathological fracture, vertebra(e), initial encounter for fracture: Secondary | ICD-10-CM | POA: Diagnosis not present

## 2023-03-30 DIAGNOSIS — E1169 Type 2 diabetes mellitus with other specified complication: Secondary | ICD-10-CM | POA: Diagnosis not present

## 2023-03-30 DIAGNOSIS — I10 Essential (primary) hypertension: Secondary | ICD-10-CM | POA: Diagnosis not present

## 2023-03-30 DIAGNOSIS — E119 Type 2 diabetes mellitus without complications: Secondary | ICD-10-CM | POA: Diagnosis not present

## 2023-03-30 DIAGNOSIS — Z23 Encounter for immunization: Secondary | ICD-10-CM | POA: Diagnosis not present

## 2023-03-30 DIAGNOSIS — E782 Mixed hyperlipidemia: Secondary | ICD-10-CM | POA: Diagnosis not present

## 2023-03-30 DIAGNOSIS — M792 Neuralgia and neuritis, unspecified: Secondary | ICD-10-CM | POA: Diagnosis not present

## 2023-03-31 DIAGNOSIS — H2513 Age-related nuclear cataract, bilateral: Secondary | ICD-10-CM | POA: Diagnosis not present

## 2023-03-31 DIAGNOSIS — E113292 Type 2 diabetes mellitus with mild nonproliferative diabetic retinopathy without macular edema, left eye: Secondary | ICD-10-CM | POA: Diagnosis not present

## 2023-03-31 DIAGNOSIS — H52203 Unspecified astigmatism, bilateral: Secondary | ICD-10-CM | POA: Diagnosis not present

## 2023-03-31 DIAGNOSIS — H25013 Cortical age-related cataract, bilateral: Secondary | ICD-10-CM | POA: Diagnosis not present

## 2023-04-06 DIAGNOSIS — E1169 Type 2 diabetes mellitus with other specified complication: Secondary | ICD-10-CM | POA: Diagnosis not present

## 2023-04-06 DIAGNOSIS — Z8639 Personal history of other endocrine, nutritional and metabolic disease: Secondary | ICD-10-CM | POA: Diagnosis not present

## 2023-04-06 DIAGNOSIS — E663 Overweight: Secondary | ICD-10-CM | POA: Diagnosis not present

## 2023-04-06 DIAGNOSIS — Z6827 Body mass index (BMI) 27.0-27.9, adult: Secondary | ICD-10-CM | POA: Diagnosis not present

## 2023-04-06 DIAGNOSIS — E782 Mixed hyperlipidemia: Secondary | ICD-10-CM | POA: Diagnosis not present

## 2023-04-06 DIAGNOSIS — I10 Essential (primary) hypertension: Secondary | ICD-10-CM | POA: Diagnosis not present

## 2023-04-21 DIAGNOSIS — G4700 Insomnia, unspecified: Secondary | ICD-10-CM | POA: Diagnosis not present

## 2023-04-27 DIAGNOSIS — G4700 Insomnia, unspecified: Secondary | ICD-10-CM | POA: Diagnosis not present

## 2023-04-27 DIAGNOSIS — I152 Hypertension secondary to endocrine disorders: Secondary | ICD-10-CM | POA: Diagnosis not present

## 2023-04-27 DIAGNOSIS — Z8639 Personal history of other endocrine, nutritional and metabolic disease: Secondary | ICD-10-CM | POA: Diagnosis not present

## 2023-04-27 DIAGNOSIS — E1169 Type 2 diabetes mellitus with other specified complication: Secondary | ICD-10-CM | POA: Diagnosis not present

## 2023-04-27 DIAGNOSIS — Z6827 Body mass index (BMI) 27.0-27.9, adult: Secondary | ICD-10-CM | POA: Diagnosis not present

## 2023-05-10 DIAGNOSIS — H25811 Combined forms of age-related cataract, right eye: Secondary | ICD-10-CM | POA: Diagnosis not present

## 2023-05-10 DIAGNOSIS — H25011 Cortical age-related cataract, right eye: Secondary | ICD-10-CM | POA: Diagnosis not present

## 2023-05-10 DIAGNOSIS — H2511 Age-related nuclear cataract, right eye: Secondary | ICD-10-CM | POA: Diagnosis not present

## 2023-05-10 DIAGNOSIS — Z961 Presence of intraocular lens: Secondary | ICD-10-CM | POA: Diagnosis not present

## 2023-05-18 DIAGNOSIS — Z8639 Personal history of other endocrine, nutritional and metabolic disease: Secondary | ICD-10-CM | POA: Diagnosis not present

## 2023-05-18 DIAGNOSIS — E782 Mixed hyperlipidemia: Secondary | ICD-10-CM | POA: Diagnosis not present

## 2023-05-18 DIAGNOSIS — E1169 Type 2 diabetes mellitus with other specified complication: Secondary | ICD-10-CM | POA: Diagnosis not present

## 2023-05-18 DIAGNOSIS — Z6827 Body mass index (BMI) 27.0-27.9, adult: Secondary | ICD-10-CM | POA: Diagnosis not present

## 2023-05-18 DIAGNOSIS — I152 Hypertension secondary to endocrine disorders: Secondary | ICD-10-CM | POA: Diagnosis not present

## 2023-05-19 DIAGNOSIS — M8008XA Age-related osteoporosis with current pathological fracture, vertebra(e), initial encounter for fracture: Secondary | ICD-10-CM | POA: Diagnosis not present

## 2023-05-19 DIAGNOSIS — R300 Dysuria: Secondary | ICD-10-CM | POA: Diagnosis not present

## 2023-05-24 DIAGNOSIS — H25012 Cortical age-related cataract, left eye: Secondary | ICD-10-CM | POA: Diagnosis not present

## 2023-05-24 DIAGNOSIS — Z961 Presence of intraocular lens: Secondary | ICD-10-CM | POA: Diagnosis not present

## 2023-05-24 DIAGNOSIS — H25812 Combined forms of age-related cataract, left eye: Secondary | ICD-10-CM | POA: Diagnosis not present

## 2023-05-24 DIAGNOSIS — H2512 Age-related nuclear cataract, left eye: Secondary | ICD-10-CM | POA: Diagnosis not present

## 2023-06-15 DIAGNOSIS — I152 Hypertension secondary to endocrine disorders: Secondary | ICD-10-CM | POA: Diagnosis not present

## 2023-06-15 DIAGNOSIS — G4700 Insomnia, unspecified: Secondary | ICD-10-CM | POA: Diagnosis not present

## 2023-06-15 DIAGNOSIS — Z8639 Personal history of other endocrine, nutritional and metabolic disease: Secondary | ICD-10-CM | POA: Diagnosis not present

## 2023-06-15 DIAGNOSIS — E1169 Type 2 diabetes mellitus with other specified complication: Secondary | ICD-10-CM | POA: Diagnosis not present

## 2023-06-15 DIAGNOSIS — Z6824 Body mass index (BMI) 24.0-24.9, adult: Secondary | ICD-10-CM | POA: Diagnosis not present

## 2023-06-23 DIAGNOSIS — Z1231 Encounter for screening mammogram for malignant neoplasm of breast: Secondary | ICD-10-CM | POA: Diagnosis not present

## 2023-06-23 DIAGNOSIS — Z124 Encounter for screening for malignant neoplasm of cervix: Secondary | ICD-10-CM | POA: Diagnosis not present

## 2023-06-23 DIAGNOSIS — Z6828 Body mass index (BMI) 28.0-28.9, adult: Secondary | ICD-10-CM | POA: Diagnosis not present

## 2023-06-23 DIAGNOSIS — N958 Other specified menopausal and perimenopausal disorders: Secondary | ICD-10-CM | POA: Diagnosis not present

## 2023-06-23 DIAGNOSIS — M8588 Other specified disorders of bone density and structure, other site: Secondary | ICD-10-CM | POA: Diagnosis not present

## 2023-07-26 DIAGNOSIS — N390 Urinary tract infection, site not specified: Secondary | ICD-10-CM | POA: Diagnosis not present

## 2023-09-28 DIAGNOSIS — E782 Mixed hyperlipidemia: Secondary | ICD-10-CM | POA: Diagnosis not present

## 2023-09-28 DIAGNOSIS — F411 Generalized anxiety disorder: Secondary | ICD-10-CM | POA: Diagnosis not present

## 2023-09-28 DIAGNOSIS — I10 Essential (primary) hypertension: Secondary | ICD-10-CM | POA: Diagnosis not present

## 2023-09-28 DIAGNOSIS — E1122 Type 2 diabetes mellitus with diabetic chronic kidney disease: Secondary | ICD-10-CM | POA: Diagnosis not present

## 2023-09-28 DIAGNOSIS — K589 Irritable bowel syndrome without diarrhea: Secondary | ICD-10-CM | POA: Diagnosis not present

## 2023-09-28 DIAGNOSIS — N181 Chronic kidney disease, stage 1: Secondary | ICD-10-CM | POA: Diagnosis not present

## 2023-09-28 DIAGNOSIS — M8008XA Age-related osteoporosis with current pathological fracture, vertebra(e), initial encounter for fracture: Secondary | ICD-10-CM | POA: Diagnosis not present

## 2023-09-28 DIAGNOSIS — M5430 Sciatica, unspecified side: Secondary | ICD-10-CM | POA: Diagnosis not present

## 2023-09-28 DIAGNOSIS — Z0000 Encounter for general adult medical examination without abnormal findings: Secondary | ICD-10-CM | POA: Diagnosis not present

## 2023-10-14 DIAGNOSIS — K760 Fatty (change of) liver, not elsewhere classified: Secondary | ICD-10-CM | POA: Diagnosis not present

## 2023-10-14 DIAGNOSIS — R112 Nausea with vomiting, unspecified: Secondary | ICD-10-CM | POA: Diagnosis not present

## 2023-10-14 DIAGNOSIS — R197 Diarrhea, unspecified: Secondary | ICD-10-CM | POA: Diagnosis not present

## 2023-10-14 DIAGNOSIS — E1169 Type 2 diabetes mellitus with other specified complication: Secondary | ICD-10-CM | POA: Diagnosis not present

## 2023-10-18 DIAGNOSIS — E663 Overweight: Secondary | ICD-10-CM | POA: Diagnosis not present

## 2023-10-18 DIAGNOSIS — E1169 Type 2 diabetes mellitus with other specified complication: Secondary | ICD-10-CM | POA: Diagnosis not present

## 2023-10-18 DIAGNOSIS — Z8639 Personal history of other endocrine, nutritional and metabolic disease: Secondary | ICD-10-CM | POA: Diagnosis not present

## 2023-10-18 DIAGNOSIS — Z6826 Body mass index (BMI) 26.0-26.9, adult: Secondary | ICD-10-CM | POA: Diagnosis not present

## 2023-10-18 DIAGNOSIS — E782 Mixed hyperlipidemia: Secondary | ICD-10-CM | POA: Diagnosis not present

## 2023-10-18 DIAGNOSIS — I152 Hypertension secondary to endocrine disorders: Secondary | ICD-10-CM | POA: Diagnosis not present

## 2023-11-15 DIAGNOSIS — I152 Hypertension secondary to endocrine disorders: Secondary | ICD-10-CM | POA: Diagnosis not present

## 2023-11-15 DIAGNOSIS — E782 Mixed hyperlipidemia: Secondary | ICD-10-CM | POA: Diagnosis not present

## 2023-11-15 DIAGNOSIS — E663 Overweight: Secondary | ICD-10-CM | POA: Diagnosis not present

## 2023-11-15 DIAGNOSIS — Z6826 Body mass index (BMI) 26.0-26.9, adult: Secondary | ICD-10-CM | POA: Diagnosis not present

## 2023-11-15 DIAGNOSIS — E1169 Type 2 diabetes mellitus with other specified complication: Secondary | ICD-10-CM | POA: Diagnosis not present

## 2023-11-15 DIAGNOSIS — Z8639 Personal history of other endocrine, nutritional and metabolic disease: Secondary | ICD-10-CM | POA: Diagnosis not present

## 2023-12-02 DIAGNOSIS — L720 Epidermal cyst: Secondary | ICD-10-CM | POA: Diagnosis not present

## 2024-01-11 DIAGNOSIS — E1169 Type 2 diabetes mellitus with other specified complication: Secondary | ICD-10-CM | POA: Diagnosis not present

## 2024-01-11 DIAGNOSIS — Z8639 Personal history of other endocrine, nutritional and metabolic disease: Secondary | ICD-10-CM | POA: Diagnosis not present

## 2024-01-11 DIAGNOSIS — I152 Hypertension secondary to endocrine disorders: Secondary | ICD-10-CM | POA: Diagnosis not present

## 2024-01-11 DIAGNOSIS — Z6826 Body mass index (BMI) 26.0-26.9, adult: Secondary | ICD-10-CM | POA: Diagnosis not present

## 2024-01-11 DIAGNOSIS — E663 Overweight: Secondary | ICD-10-CM | POA: Diagnosis not present

## 2024-01-11 DIAGNOSIS — E782 Mixed hyperlipidemia: Secondary | ICD-10-CM | POA: Diagnosis not present

## 2024-01-31 DIAGNOSIS — E663 Overweight: Secondary | ICD-10-CM | POA: Diagnosis not present

## 2024-01-31 DIAGNOSIS — Z794 Long term (current) use of insulin: Secondary | ICD-10-CM | POA: Diagnosis not present

## 2024-01-31 DIAGNOSIS — E1142 Type 2 diabetes mellitus with diabetic polyneuropathy: Secondary | ICD-10-CM | POA: Diagnosis not present

## 2024-01-31 DIAGNOSIS — E1169 Type 2 diabetes mellitus with other specified complication: Secondary | ICD-10-CM | POA: Diagnosis not present

## 2024-01-31 DIAGNOSIS — E1122 Type 2 diabetes mellitus with diabetic chronic kidney disease: Secondary | ICD-10-CM | POA: Diagnosis not present

## 2024-01-31 DIAGNOSIS — G629 Polyneuropathy, unspecified: Secondary | ICD-10-CM | POA: Diagnosis not present

## 2024-01-31 DIAGNOSIS — G4700 Insomnia, unspecified: Secondary | ICD-10-CM | POA: Diagnosis not present

## 2024-01-31 DIAGNOSIS — I129 Hypertensive chronic kidney disease with stage 1 through stage 4 chronic kidney disease, or unspecified chronic kidney disease: Secondary | ICD-10-CM | POA: Diagnosis not present

## 2024-01-31 DIAGNOSIS — F3341 Major depressive disorder, recurrent, in partial remission: Secondary | ICD-10-CM | POA: Diagnosis not present

## 2024-01-31 DIAGNOSIS — F419 Anxiety disorder, unspecified: Secondary | ICD-10-CM | POA: Diagnosis not present

## 2024-01-31 DIAGNOSIS — E559 Vitamin D deficiency, unspecified: Secondary | ICD-10-CM | POA: Diagnosis not present

## 2024-01-31 DIAGNOSIS — E785 Hyperlipidemia, unspecified: Secondary | ICD-10-CM | POA: Diagnosis not present

## 2024-02-02 DIAGNOSIS — D224 Melanocytic nevi of scalp and neck: Secondary | ICD-10-CM | POA: Diagnosis not present

## 2024-02-02 DIAGNOSIS — L578 Other skin changes due to chronic exposure to nonionizing radiation: Secondary | ICD-10-CM | POA: Diagnosis not present

## 2024-02-14 DIAGNOSIS — M25511 Pain in right shoulder: Secondary | ICD-10-CM | POA: Diagnosis not present

## 2024-02-14 DIAGNOSIS — M810 Age-related osteoporosis without current pathological fracture: Secondary | ICD-10-CM | POA: Diagnosis not present

## 2024-02-14 DIAGNOSIS — G4700 Insomnia, unspecified: Secondary | ICD-10-CM | POA: Diagnosis not present

## 2024-02-14 DIAGNOSIS — M542 Cervicalgia: Secondary | ICD-10-CM | POA: Diagnosis not present

## 2024-02-15 DIAGNOSIS — M542 Cervicalgia: Secondary | ICD-10-CM | POA: Diagnosis not present

## 2024-02-15 DIAGNOSIS — M25511 Pain in right shoulder: Secondary | ICD-10-CM | POA: Diagnosis not present

## 2024-02-20 DIAGNOSIS — M25511 Pain in right shoulder: Secondary | ICD-10-CM | POA: Diagnosis not present

## 2024-02-20 DIAGNOSIS — M542 Cervicalgia: Secondary | ICD-10-CM | POA: Diagnosis not present

## 2024-03-13 DIAGNOSIS — M5416 Radiculopathy, lumbar region: Secondary | ICD-10-CM | POA: Diagnosis not present

## 2024-04-04 DIAGNOSIS — I10 Essential (primary) hypertension: Secondary | ICD-10-CM | POA: Diagnosis not present

## 2024-04-04 DIAGNOSIS — N181 Chronic kidney disease, stage 1: Secondary | ICD-10-CM | POA: Diagnosis not present

## 2024-04-04 DIAGNOSIS — G4700 Insomnia, unspecified: Secondary | ICD-10-CM | POA: Diagnosis not present

## 2024-04-04 DIAGNOSIS — E1122 Type 2 diabetes mellitus with diabetic chronic kidney disease: Secondary | ICD-10-CM | POA: Diagnosis not present

## 2024-04-04 DIAGNOSIS — M8008XA Age-related osteoporosis with current pathological fracture, vertebra(e), initial encounter for fracture: Secondary | ICD-10-CM | POA: Diagnosis not present

## 2024-04-04 DIAGNOSIS — E782 Mixed hyperlipidemia: Secondary | ICD-10-CM | POA: Diagnosis not present

## 2024-04-04 DIAGNOSIS — Z23 Encounter for immunization: Secondary | ICD-10-CM | POA: Diagnosis not present

## 2024-04-18 DIAGNOSIS — M542 Cervicalgia: Secondary | ICD-10-CM | POA: Diagnosis not present

## 2024-04-23 DIAGNOSIS — Z6826 Body mass index (BMI) 26.0-26.9, adult: Secondary | ICD-10-CM | POA: Diagnosis not present

## 2024-04-23 DIAGNOSIS — E663 Overweight: Secondary | ICD-10-CM | POA: Diagnosis not present

## 2024-04-23 DIAGNOSIS — E1169 Type 2 diabetes mellitus with other specified complication: Secondary | ICD-10-CM | POA: Diagnosis not present

## 2024-04-23 DIAGNOSIS — E782 Mixed hyperlipidemia: Secondary | ICD-10-CM | POA: Diagnosis not present

## 2024-04-23 DIAGNOSIS — Z8639 Personal history of other endocrine, nutritional and metabolic disease: Secondary | ICD-10-CM | POA: Diagnosis not present

## 2024-04-23 DIAGNOSIS — I152 Hypertension secondary to endocrine disorders: Secondary | ICD-10-CM | POA: Diagnosis not present

## 2024-04-26 DIAGNOSIS — M542 Cervicalgia: Secondary | ICD-10-CM | POA: Diagnosis not present

## 2024-05-08 DIAGNOSIS — M47812 Spondylosis without myelopathy or radiculopathy, cervical region: Secondary | ICD-10-CM | POA: Diagnosis not present

## 2024-05-14 DIAGNOSIS — I129 Hypertensive chronic kidney disease with stage 1 through stage 4 chronic kidney disease, or unspecified chronic kidney disease: Secondary | ICD-10-CM | POA: Diagnosis not present

## 2024-05-14 DIAGNOSIS — R350 Frequency of micturition: Secondary | ICD-10-CM | POA: Diagnosis not present

## 2024-05-14 DIAGNOSIS — E1122 Type 2 diabetes mellitus with diabetic chronic kidney disease: Secondary | ICD-10-CM | POA: Diagnosis not present

## 2024-05-15 DIAGNOSIS — I152 Hypertension secondary to endocrine disorders: Secondary | ICD-10-CM | POA: Diagnosis not present

## 2024-05-15 DIAGNOSIS — E782 Mixed hyperlipidemia: Secondary | ICD-10-CM | POA: Diagnosis not present

## 2024-05-15 DIAGNOSIS — E1169 Type 2 diabetes mellitus with other specified complication: Secondary | ICD-10-CM | POA: Diagnosis not present

## 2024-05-15 DIAGNOSIS — Z6826 Body mass index (BMI) 26.0-26.9, adult: Secondary | ICD-10-CM | POA: Diagnosis not present

## 2024-05-15 DIAGNOSIS — Z8639 Personal history of other endocrine, nutritional and metabolic disease: Secondary | ICD-10-CM | POA: Diagnosis not present

## 2024-05-15 DIAGNOSIS — E663 Overweight: Secondary | ICD-10-CM | POA: Diagnosis not present

## 2024-05-22 DIAGNOSIS — M810 Age-related osteoporosis without current pathological fracture: Secondary | ICD-10-CM | POA: Diagnosis not present

## 2024-05-24 DIAGNOSIS — M47812 Spondylosis without myelopathy or radiculopathy, cervical region: Secondary | ICD-10-CM | POA: Diagnosis not present

## 2024-06-12 DIAGNOSIS — M47812 Spondylosis without myelopathy or radiculopathy, cervical region: Secondary | ICD-10-CM | POA: Diagnosis not present

## 2024-06-14 DIAGNOSIS — M47812 Spondylosis without myelopathy or radiculopathy, cervical region: Secondary | ICD-10-CM | POA: Diagnosis not present

## 2024-06-14 DIAGNOSIS — M542 Cervicalgia: Secondary | ICD-10-CM | POA: Diagnosis not present

## 2024-06-14 DIAGNOSIS — M5416 Radiculopathy, lumbar region: Secondary | ICD-10-CM | POA: Diagnosis not present

## 2024-06-14 DIAGNOSIS — M546 Pain in thoracic spine: Secondary | ICD-10-CM | POA: Diagnosis not present
